# Patient Record
Sex: Male | Born: 1955 | Race: Black or African American | Hispanic: No | Marital: Single | Smoking: Current every day smoker
Health system: Southern US, Community
[De-identification: ages and names within clinical notes are randomized; demographics above are authoritative.]

## PROBLEM LIST (undated history)

## (undated) DIAGNOSIS — I499 Cardiac arrhythmia, unspecified: Secondary | ICD-10-CM

## (undated) DIAGNOSIS — E1140 Type 2 diabetes mellitus with diabetic neuropathy, unspecified: Secondary | ICD-10-CM

## (undated) DIAGNOSIS — I10 Essential (primary) hypertension: Secondary | ICD-10-CM

## (undated) DIAGNOSIS — Z8619 Personal history of other infectious and parasitic diseases: Secondary | ICD-10-CM

## (undated) DIAGNOSIS — M1990 Unspecified osteoarthritis, unspecified site: Secondary | ICD-10-CM

## (undated) HISTORY — DX: Essential (primary) hypertension: I10

## (undated) HISTORY — PX: EYE SURGERY: SHX253

## (undated) HISTORY — DX: Type 2 diabetes mellitus with diabetic neuropathy, unspecified: E11.40

---

## 2009-03-10 ENCOUNTER — Emergency Department (HOSPITAL_COMMUNITY): Admission: EM | Admit: 2009-03-10 | Discharge: 2009-03-10 | Payer: Self-pay | Admitting: Emergency Medicine

## 2009-04-26 ENCOUNTER — Emergency Department (HOSPITAL_COMMUNITY): Admission: EM | Admit: 2009-04-26 | Discharge: 2009-04-26 | Payer: Self-pay | Admitting: Family Medicine

## 2009-06-15 ENCOUNTER — Emergency Department (HOSPITAL_COMMUNITY): Admission: EM | Admit: 2009-06-15 | Discharge: 2009-06-15 | Payer: Self-pay | Admitting: Emergency Medicine

## 2010-09-22 LAB — POCT I-STAT, CHEM 8
Creatinine, Ser: 0.7 mg/dL (ref 0.4–1.5)
Hemoglobin: 17.7 g/dL — ABNORMAL HIGH (ref 13.0–17.0)
Potassium: 4.2 mEq/L (ref 3.5–5.1)
Sodium: 138 mEq/L (ref 135–145)
TCO2: 24 mmol/L (ref 0–100)

## 2010-09-22 LAB — GLUCOSE, CAPILLARY: Glucose-Capillary: 335 mg/dL — ABNORMAL HIGH (ref 70–99)

## 2016-06-20 HISTORY — PX: OTHER SURGICAL HISTORY: SHX169

## 2016-06-20 HISTORY — PX: FRACTURE SURGERY: SHX138

## 2016-07-22 ENCOUNTER — Ambulatory Visit: Payer: Self-pay | Admitting: Cardiovascular Disease

## 2016-09-07 ENCOUNTER — Telehealth: Payer: Self-pay

## 2016-09-07 NOTE — Telephone Encounter (Signed)
Pt received triage letter from DS. Please call him back at [phone]

## 2016-09-08 ENCOUNTER — Telehealth: Payer: Self-pay

## 2016-09-08 NOTE — Telephone Encounter (Signed)
See separate triage.  

## 2016-09-12 NOTE — Telephone Encounter (Signed)
Gastroenterology Pre-Procedure Review  Request Date:09/08/2016 Requesting Physician:   PATIENT REVIEW QUESTIONS: The patient responded to the following health history questions as indicated:    1. Diabetes Melitis: YES 2. Joint replacements in the past 12 months: no 3. Major health problems in the past 3 months: no 4. Has an artificial valve or MVP: no 5. Has a defibrillator: no 6. Has been advised in past to take antibiotics in advance of a procedure like teeth cleaning: no 7. Family history of colon cancer: no  8. Alcohol Use: Occasionally 9. History of sleep apnea: no  10. History of coronary artery or other vascular stents placed within the last 12 months: NO  MEDICATIONS & ALLERGIES:    Patient reports the following regarding taking any blood thinners:   Plavix? no Aspirin? no Coumadin? no Brilinta? no Xarelto? no Eliquis? no Pradaxa? no Savaysa? no Effient? no  Patient confirms/reports the following medications:  Current Outpatient Prescriptions  Medication Sig Dispense Refill  . gabapentin (NEURONTIN) 400 MG capsule Take 400 mg by mouth 3 (three) times daily. PT takes 2 tablets ( 800 mg) tid    . glipiZIDE (GLUCOTROL) 5 MG tablet Take by mouth 2 (two) times daily before a meal.    . metFORMIN (GLUCOPHAGE) 1000 MG tablet Take 1,000 mg by mouth 2 (two) times daily with a meal.     No current facility-administered medications for this visit.     Patient confirms/reports the following allergies:  No Known Allergies  No orders of the defined types were placed in this encounter.   AUTHORIZATION INFORMATION Primary Insurance:   ID #:  Group #:  Pre-Cert / Auth required:  Pre-Cert / Auth #:   Secondary Insurance:   ID #:   Group #:  Pre-Cert / Auth required: Pre-Cert / Auth #:   SCHEDULE INFORMATION: Procedure has been scheduled as follows:  Date: 10/18/2016              Time: 10:00 AM Location: [facility] Short Stay  This Gastroenterology Pre-Precedure  Review Form is being routed to the following provider(s): R. Garfield Cornea, MD

## 2016-09-12 NOTE — Telephone Encounter (Signed)
Ok to schedule. DM meds: half night before and none morning of.

## 2016-09-14 ENCOUNTER — Other Ambulatory Visit: Payer: Self-pay

## 2016-09-14 DIAGNOSIS — Z800 Family history of malignant neoplasm of digestive organs: Secondary | ICD-10-CM

## 2016-09-14 MED ORDER — PEG 3350-KCL-NA BICARB-NACL 420 G PO SOLR: 4000.0000 mL | ORAL | 0 refills | Status: DC

## 2016-09-14 NOTE — Telephone Encounter (Signed)
Rx sent to the pharmacy and instructions mailed to pt.  

## 2016-10-06 ENCOUNTER — Other Ambulatory Visit (HOSPITAL_COMMUNITY): Payer: Self-pay | Admitting: Neurology

## 2016-10-06 DIAGNOSIS — R29898 Other symptoms and signs involving the musculoskeletal system: Secondary | ICD-10-CM

## 2016-10-06 DIAGNOSIS — R52 Pain, unspecified: Secondary | ICD-10-CM

## 2016-10-10 ENCOUNTER — Telehealth: Payer: Self-pay | Admitting: Internal Medicine

## 2016-10-10 NOTE — Telephone Encounter (Signed)
PT wanted to be scheduled later in the day on 10/18/2016. I told him there were no appts available on that day, I would have to schedule him for another day. He did not want to change to another day. He said he will try to get someone else to bring him, the previous person had to go to [facility] early that morning and then will be returning. I told him he has to have someone with him that can stay with him while he has the procedure.  He will let me know if there is a problem.

## 2016-10-10 NOTE — Telephone Encounter (Signed)
Pt is scheduled with [facility] on 5/1 for colonoscopy. He needs to change the times because he can't be at the hospital at 9 am. Please call him back at [phone]

## 2016-10-13 ENCOUNTER — Ambulatory Visit (HOSPITAL_COMMUNITY): Payer: Medicaid Other

## 2016-10-18 ENCOUNTER — Encounter (HOSPITAL_COMMUNITY)
Admission: RE | Disposition: A | Discharge status: Home / Self Care | Payer: Self-pay | Source: Ambulatory Visit | Attending: Internal Medicine

## 2016-10-18 ENCOUNTER — Ambulatory Visit (HOSPITAL_COMMUNITY)
Admission: RE | Admit: 2016-10-18 | Discharge: 2016-10-18 | Disposition: A | Discharge status: Home / Self Care | Payer: Medicaid Other | Source: Ambulatory Visit | Attending: Internal Medicine | Admitting: Internal Medicine

## 2016-10-18 ENCOUNTER — Encounter (HOSPITAL_COMMUNITY): Payer: Self-pay | Admitting: *Deleted

## 2016-10-18 DIAGNOSIS — E119 Type 2 diabetes mellitus without complications: Secondary | ICD-10-CM | POA: Diagnosis not present

## 2016-10-18 DIAGNOSIS — Z7984 Long term (current) use of oral hypoglycemic drugs: Secondary | ICD-10-CM | POA: Diagnosis not present

## 2016-10-18 DIAGNOSIS — Z800 Family history of malignant neoplasm of digestive organs: Secondary | ICD-10-CM | POA: Diagnosis not present

## 2016-10-18 DIAGNOSIS — F17210 Nicotine dependence, cigarettes, uncomplicated: Secondary | ICD-10-CM | POA: Insufficient documentation

## 2016-10-18 DIAGNOSIS — D123 Benign neoplasm of transverse colon: Secondary | ICD-10-CM | POA: Diagnosis not present

## 2016-10-18 DIAGNOSIS — Z79899 Other long term (current) drug therapy: Secondary | ICD-10-CM | POA: Insufficient documentation

## 2016-10-18 DIAGNOSIS — Z1211 Encounter for screening for malignant neoplasm of colon: Secondary | ICD-10-CM | POA: Diagnosis not present

## 2016-10-18 DIAGNOSIS — I10 Essential (primary) hypertension: Secondary | ICD-10-CM | POA: Diagnosis not present

## 2016-10-18 DIAGNOSIS — Z1212 Encounter for screening for malignant neoplasm of rectum: Secondary | ICD-10-CM | POA: Diagnosis not present

## 2016-10-18 HISTORY — PX: COLONOSCOPY: SHX5424

## 2016-10-18 LAB — GLUCOSE, CAPILLARY: GLUCOSE-CAPILLARY: 188 mg/dL — AB (ref 65–99)

## 2016-10-18 SURGERY — COLONOSCOPY
Anesthesia: Moderate Sedation

## 2016-10-18 MED ORDER — MIDAZOLAM HCL 5 MG/5ML IJ SOLN
INTRAMUSCULAR | Status: AC
  Filled 2016-10-18: qty 10

## 2016-10-18 MED ORDER — SODIUM CHLORIDE 0.9 % IV SOLN
INTRAVENOUS | Status: DC
  Administered 2016-10-18: 10:00:00 via INTRAVENOUS

## 2016-10-18 MED ORDER — MEPERIDINE HCL 100 MG/ML IJ SOLN
INTRAMUSCULAR | Status: AC
  Filled 2016-10-18: qty 2

## 2016-10-18 MED ORDER — STERILE WATER FOR IRRIGATION IR SOLN
Status: DC | PRN
  Administered 2016-10-18: 10:00:00

## 2016-10-18 MED ORDER — ONDANSETRON HCL 4 MG/2ML IJ SOLN
INTRAMUSCULAR | Status: DC | PRN
  Administered 2016-10-18: 4 mg via INTRAVENOUS

## 2016-10-18 MED ORDER — MIDAZOLAM HCL 5 MG/5ML IJ SOLN
INTRAMUSCULAR | Status: DC | PRN
  Administered 2016-10-18 (×2): 2 mg via INTRAVENOUS

## 2016-10-18 MED ORDER — MEPERIDINE HCL 100 MG/ML IJ SOLN
INTRAMUSCULAR | Status: DC | PRN
  Administered 2016-10-18: 50 mg via INTRAVENOUS
  Administered 2016-10-18: 25 mg via INTRAVENOUS

## 2016-10-18 MED ORDER — LIDOCAINE VISCOUS 2 % MT SOLN
OROMUCOSAL | Status: AC
  Filled 2016-10-18: qty 15

## 2016-10-18 MED ORDER — ONDANSETRON HCL 4 MG/2ML IJ SOLN
INTRAMUSCULAR | Status: AC
  Filled 2016-10-18: qty 2

## 2016-10-18 NOTE — H&P (Signed)
@LOGO @   Primary Care Physician:  Jessee Avers, FNP Primary Gastroenterologist:  Dr. Gala Romney  Pre-Procedure History & Physical: HPI:  Ryan King is a 61 y.o. male is here for a screening colonoscopy.  No bowel symptoms. Family history of colon cancer in his brother-diagnosed in his 28s. No prior colonoscopy.  Past Medical History:  Diagnosis Date  . Diabetes mellitus without complication ([place])   . Hypertension     Past Surgical History:  Procedure Laterality Date  . Arm Surgery Right 06/2016  . FRACTURE SURGERY Right 06/2016    Prior to Admission medications   Medication Sig Start Date End Date Taking? Authorizing Provider  glipiZIDE (GLUCOTROL) 5 MG tablet Take by mouth 2 (two) times daily before a meal.   Yes Historical Provider, MD  metFORMIN (GLUCOPHAGE) 1000 MG tablet Take 1,000 mg by mouth 2 (two) times daily with a meal.   Yes Historical Provider, MD  polyethylene glycol-electrolytes (TRILYTE) 420 g solution Take 4,000 mLs by mouth as directed. 09/14/16  Yes Daneil Dolin, MD  pregabalin (LYRICA) 50 MG capsule Take 50 mg by mouth 2 (two) times daily.   Yes Historical Provider, MD  traMADol (ULTRAM) 50 MG tablet Take 50-100 mg by mouth 2 (two) times daily as needed for moderate pain.   Yes Historical Provider, MD    Allergies as of 09/14/2016  . (No Known Allergies)    Family History  Problem Relation Age of Onset  . Diabetes Mother   . Hypertension Mother   . Lung cancer Brother   . Multiple sclerosis Brother   . Stroke Brother   . Diabetes Mellitus II Brother     Social History   Social History  . Marital status: Single    Spouse name: N/A  . Number of children: N/A  . Years of education: N/A   Occupational History  . Not on file.   Social History Main Topics  . Smoking status: Current Every Day Smoker    Packs/day: 1.00    Years: 45.00    Types: Cigarettes  . Smokeless tobacco: Never Used  . Alcohol use Yes     Comment: Rarely-Once month  .  Drug use: No  . Sexual activity: Not on file   Other Topics Concern  . Not on file   Social History Narrative  . No narrative on file    Review of Systems: See HPI, otherwise negative ROS  Physical Exam: BP (!) 151/95   Pulse 75   Temp 98.7 F (37.1 C) (Oral)   Resp (!) 21   Ht 5\' 10"  (1.778 m)   Wt 150 lb (68 kg)   SpO2 100%   BMI 21.52 kg/m  General:   Alert,   pleasant and cooperative in NAD Neck:  Supple; no masses or thyromegaly. Lungs:  Clear throughout to auscultation.   No wheezes, crackles, or rhonchi. No acute distress. Heart:  Regular rate and rhythm; no murmurs, clicks, rubs,  or gallops. Abdomen:  Soft, nontender and nondistended. No masses, hepatosplenomegaly or hernias noted. Normal bowel sounds, without guarding, and without rebound.    Impression/Plan: Ryan King is now here to undergo a screening colonoscopy.  First ever high-risk screening examination.  Risks, benefits, limitations, imponderables and alternatives regarding colonoscopy have been reviewed with the patient. Questions have been answered. All parties agreeable.     Notice:  This dictation was prepared with Dragon dictation along with smaller phrase technology. Any transcriptional errors that result from this process are  unintentional and may not be corrected upon review.

## 2016-10-18 NOTE — Discharge Instructions (Signed)
Polyp information provided  Further recommendations to follow pending review of pathology report  Colonoscopy Discharge Instructions  Read the instructions outlined below and refer to this sheet in the next few weeks. These discharge instructions provide you with general information on caring for yourself after you leave the hospital. Your doctor may also give you specific instructions. While your treatment has been planned according to the most current medical practices available, unavoidable complications occasionally occur. If you have any problems or questions after discharge, call Dr. Gala Romney at [phone]. ACTIVITY  You may resume your regular activity, but move at a slower pace for the next 24 hours.   Take frequent rest periods for the next 24 hours.   Walking will help get rid of the air and reduce the bloated feeling in your belly (abdomen).   No driving for 24 hours (because of the medicine (anesthesia) used during the test).    Do not sign any important legal documents or operate any machinery for 24 hours (because of the anesthesia used during the test).  NUTRITION  Drink plenty of fluids.   You may resume your normal diet as instructed by your doctor.   Begin with a light meal and progress to your normal diet. Heavy or fried foods are harder to digest and may make you feel sick to your stomach (nauseated).   Avoid alcoholic beverages for 24 hours or as instructed.  MEDICATIONS  You may resume your normal medications unless your doctor tells you otherwise.  WHAT YOU CAN EXPECT TODAY  Some feelings of bloating in the abdomen.   Passage of more gas than usual.   Spotting of blood in your stool or on the toilet paper.  IF YOU HAD POLYPS REMOVED DURING THE COLONOSCOPY:  No aspirin products for 7 days or as instructed.   No alcohol for 7 days or as instructed.   Eat a soft diet for the next 24 hours.  FINDING OUT THE RESULTS OF YOUR TEST Not all test results are  available during your visit. If your test results are not back during the visit, make an appointment with your caregiver to find out the results. Do not assume everything is normal if you have not heard from your caregiver or the medical facility. It is important for you to follow up on all of your test results.  SEEK IMMEDIATE MEDICAL ATTENTION IF:  You have more than a spotting of blood in your stool.   Your belly is swollen (abdominal distention).   You are nauseated or vomiting.   You have a temperature over 101.   You have abdominal pain or discomfort that is severe or gets worse throughout the day.    Colon Polyps Polyps are tissue growths inside the body. Polyps can grow in many places, including the large intestine (colon). A polyp may be a round bump or a mushroom-shaped growth. You could have one polyp or several. Most colon polyps are noncancerous (benign). However, some colon polyps can become cancerous over time. What are the causes? The exact cause of colon polyps is not known. What increases the risk? This condition is more likely to develop in people who:  Have a family history of colon cancer or colon polyps.  Are older than 37 or older than 45 if they are African American.  Have inflammatory bowel disease, such as ulcerative colitis or Crohn disease.  Are overweight.  Smoke cigarettes.  Do not get enough exercise.  Drink too much alcohol.  Eat  a diet that is:  High in fat and red meat.  Low in fiber.  Had childhood cancer that was treated with abdominal radiation. What are the signs or symptoms? Most polyps do not cause symptoms. If you have symptoms, they may include:  Blood coming from your rectum when having a bowel movement.  Blood in your stool.The stool may look dark red or black.  A change in bowel habits, such as constipation or diarrhea. How is this diagnosed? This condition is diagnosed with a colonoscopy. This is a procedure that uses  a lighted, flexible scope to look at the inside of your colon. How is this treated? Treatment for this condition involves removing any polyps that are found. Those polyps will then be tested for cancer. If cancer is found, your health care provider will talk to you about options for colon cancer treatment. Follow these instructions at home: Diet   Eat plenty of fiber, such as fruits, vegetables, and whole grains.  Eat foods that are high in calcium and vitamin D, such as milk, cheese, yogurt, eggs, liver, fish, and broccoli.  Limit foods high in fat, red meats, and processed meats, such as hot dogs, sausage, bacon, and lunch meats.  Maintain a healthy weight, or lose weight if recommended by your health care provider. General instructions   Do not smoke cigarettes.  Do not drink alcohol excessively.  Keep all follow-up visits as told by your health care provider. This is important. This includes keeping regularly scheduled colonoscopies. Talk to your health care provider about when you need a colonoscopy.  Exercise every day or as told by your health care provider. Contact a health care provider if:  You have new or worsening bleeding during a bowel movement.  You have new or increased blood in your stool.  You have a change in bowel habits.  You unexpectedly lose weight. This information is not intended to replace advice given to you by your health care provider. Make sure you discuss any questions you have with your health care provider. Document Released: 03/02/2004 Document Revised: 11/12/2015 Document Reviewed: 04/27/2015 Elsevier Interactive Patient Education  2017 [employer].

## 2016-10-18 NOTE — Op Note (Signed)
[facility] Patient Name: Ryan King Procedure Date: 10/18/2016 9:47 AM MRN: [id number] Date of Birth: 11-27-1955 Attending MD: Norvel Richards , MD CSN: [id number] Age: 61 Admit Type: Outpatient Procedure:                Colonoscopy with multiple snare polypectomies Indications:              Screening for colorectal malignant neoplasm Providers:                Norvel Richards, MD, Janeece Riggers, RN, Randa Spike, Technician Referring MD:              Medicines:                Midazolam 4 mg IV, Meperidine 75 mg IV, Ondansetron                            4 mg IV Complications:            No immediate complications. Estimated Blood Loss:     Estimated blood loss was minimal. Procedure:                Pre-Anesthesia Assessment:                           - Prior to the procedure, a History and Physical                            was performed, and patient medications and                            allergies were reviewed. The patient's tolerance of                            previous anesthesia was also reviewed. The risks                            and benefits of the procedure and the sedation                            options and risks were discussed with the patient.                            All questions were answered, and informed consent                            was obtained. Prior Anticoagulants: The patient has                            taken no previous anticoagulant or antiplatelet                            agents. ASA Grade Assessment: III - A patient with  severe systemic disease. After reviewing the risks                            and benefits, the patient was deemed in                            satisfactory condition to undergo the procedure.                           After obtaining informed consent, the colonoscope                            was passed under direct vision. Throughout the                        procedure, the patient's blood pressure, pulse, and                            oxygen saturations were monitored continuously. The                            EC-[id number] ([id number]) scope was introduced through                            the anus and advanced to the the cecum, identified                            by appendiceal orifice and ileocecal valve. The                            ileocecal valve, appendiceal orifice, and rectum                            were photographed. The entire colon was well                            visualized. The colonoscopy was performed without                            difficulty. The quality of the bowel preparation                            was adequate. Scope In: 10:12:53 AM Scope Out: 10:36:56 AM Scope Withdrawal Time: 0 hours 16 minutes 2 seconds  Total Procedure Duration: 0 hours 24 minutes 3 seconds  Findings:      The perianal and digital rectal examinations were normal.      The exam was otherwise without abnormality on direct and retroflexion       views.      Four sessile polyps were found in the hepatic flexure. The polyps were 4       to 7 mm in size. These polyps were removed with a cold snare. Resection       and retrieval were complete. Impression:               - The examination was  otherwise normal on direct                            and retroflexion views.                           - Four 4 to 7 mm polyps at the hepatic flexure,                            removed with a cold snare. Resected and retrieved. Moderate Sedation:      Moderate (conscious) sedation was administered by the endoscopy nurse       and supervised by the endoscopist. The following parameters were       monitored: oxygen saturation, heart rate, blood pressure, respiratory       rate, EKG, adequacy of pulmonary ventilation, and response to care.       Total physician intraservice time was 32 minutes. Recommendation:           - Patient has  a contact number available for                            emergencies. The signs and symptoms of potential                            delayed complications were discussed with the                            patient. Return to normal activities tomorrow.                            Written discharge instructions were provided to the                            patient.                           - Resume previous diet.                           - Continue present medications.                           - Repeat colonoscopy date to be determined after                            pending pathology results are reviewed for                            surveillance.                           - Return to GI clinic (date not yet determined). Procedure Code(s):        --- Professional ---                           [phone], Colonoscopy, flexible; with removal of  tumor(s), polyp(s), or other lesion(s) by snare                            technique                           99152, Moderate sedation services provided by the                            same physician or other qualified health care                            professional performing the diagnostic or                            therapeutic service that the sedation supports,                            requiring the presence of an independent trained                            observer to assist in the monitoring of the                            patient's level of consciousness and physiological                            status; initial 15 minutes of intraservice time,                            patient age 27 years or older                           [id number], Moderate sedation services; each additional                            15 minutes intraservice time Diagnosis Code(s):        --- Professional ---                           Z12.11, Encounter for screening for malignant                            neoplasm of colon                            D12.3, Benign neoplasm of transverse colon (hepatic                            flexure or splenic flexure) CPT copyright 2016 American Medical Association. All rights reserved. The codes documented in this report are preliminary and upon coder review may  be revised to meet current compliance requirements. Cristopher Estimable. Artesia Berkey, MD Norvel Richards, MD 10/18/2016 10:48:23 AM This report has been signed electronically. Number of Addenda: 0

## 2016-10-20 ENCOUNTER — Encounter (HOSPITAL_COMMUNITY): Payer: Self-pay | Admitting: Internal Medicine

## 2016-10-21 ENCOUNTER — Ambulatory Visit (INDEPENDENT_AMBULATORY_CARE_PROVIDER_SITE_OTHER): Payer: Medicaid Other | Admitting: Cardiology

## 2016-10-21 ENCOUNTER — Ambulatory Visit (INDEPENDENT_AMBULATORY_CARE_PROVIDER_SITE_OTHER): Payer: Medicaid Other

## 2016-10-21 ENCOUNTER — Encounter: Payer: Self-pay | Admitting: Cardiology

## 2016-10-21 VITALS — BP 122/74 | HR 82 | Ht 70.0 in | Wt 150.0 lb

## 2016-10-21 DIAGNOSIS — E1140 Type 2 diabetes mellitus with diabetic neuropathy, unspecified: Secondary | ICD-10-CM | POA: Diagnosis not present

## 2016-10-21 DIAGNOSIS — I499 Cardiac arrhythmia, unspecified: Secondary | ICD-10-CM | POA: Diagnosis not present

## 2016-10-21 DIAGNOSIS — Z8679 Personal history of other diseases of the circulatory system: Secondary | ICD-10-CM | POA: Diagnosis not present

## 2016-10-21 DIAGNOSIS — Z720 Tobacco use: Secondary | ICD-10-CM | POA: Diagnosis not present

## 2016-10-21 NOTE — Progress Notes (Signed)
Cardiology Office Note  Date: 10/21/2016   ID: Ryan King, DOB 1955/12/29, MRN [id number]  PCP: Jessee Avers, FNP  Consulting Cardiologist: Rozann Lesches, MD   Chief Complaint  Patient presents with  . Irregular heartbeat    History of Present Illness: Ryan King is a 61 y.o. male referred for cardiology consultation by Ms. Eulas Post NP with the [facility] for the assessment of irregular heartbeat. My understanding is that he was found to have an irregular heart beat on examination, did have an ECG obtained as detailed below. He is not however aware of any specific sense of palpitations, dizziness, or chest pain. His main complaint is of peripheral neuropathy symptoms.  I did review the faxed copy of that his ECG as detailed below. He had a PAC noted at that time. He does not have any history of cardiac arrhythmias.  We went over his medications. He is currently on oral agents for management of type 2 diabetes mellitus.  Past Medical History:  Diagnosis Date  . Essential hypertension   . Type 2 diabetes mellitus with diabetic neuropathy [facility])     Past Surgical History:  Procedure Laterality Date  . Arm Surgery Right 06/2016  . COLONOSCOPY N/A 10/18/2016   Procedure: COLONOSCOPY;  Surgeon: Daneil Dolin, MD;  Location: [facility];  Service: Endoscopy;  Laterality: N/A;  10:00 AM  . FRACTURE SURGERY Right 06/2016    Current Outpatient Prescriptions  Medication Sig Dispense Refill  . glipiZIDE (GLUCOTROL) 5 MG tablet Take by mouth 2 (two) times daily before a meal.    . metFORMIN (GLUCOPHAGE) 1000 MG tablet Take 1,000 mg by mouth 2 (two) times daily with a meal.    . pregabalin (LYRICA) 50 MG capsule Take 50 mg by mouth 2 (two) times daily.    . traMADol (ULTRAM) 50 MG tablet Take 50-100 mg by mouth 2 (two) times daily as needed for moderate pain.    . polyethylene glycol-electrolytes (TRILYTE) 420 g solution Take 4,000 mLs by mouth as directed. 4000 mL  0   No current facility-administered medications for this visit.    Allergies:  Patient has no known allergies.   Social History: The patient  reports that he has been smoking Cigarettes.  He has a 45.00 pack-year smoking history. He has never used smokeless tobacco. He reports that he drinks alcohol. He reports that he does not use drugs.   Family History: The patient's family history includes Diabetes in his mother; Diabetes Mellitus II in his brother; Hypertension in his mother; Lung cancer in his brother; Multiple sclerosis in his brother; Stroke in his brother.   ROS:  Please see the history of present illness. Otherwise, complete review of systems is positive for peripheral neuropathy, describes numbness and tingling in his feet and legs.  All other systems are reviewed and negative.   Physical Exam: VS:  BP 122/74 (BP Location: Right Arm)   Pulse 82   Ht 5\' 10"  (1.778 m)   Wt 150 lb (68 kg)   SpO2 96%   BMI 21.52 kg/m , BMI Body mass index is 21.52 kg/m.  Wt Readings from Last 3 Encounters:  10/21/16 150 lb (68 kg)  10/18/16 150 lb (68 kg)    General: Thin male, appears comfortable at rest. HEENT: Conjunctiva and lids normal, oropharynx clear with poor dentition. Neck: Supple, no elevated JVP or carotid bruits, no thyromegaly. Lungs: Clear to auscultation, nonlabored breathing at rest. Cardiac: Regular rate and  rhythm, soft S4, no significant systolic murmur, no pericardial rub. Abdomen: Soft, nontender, bowel sounds present, no guarding or rebound. Extremities: No pitting edema, distal pulses 1-2+. Skin: Warm and dry. Musculoskeletal: No kyphosis. Neuropsychiatric: Alert and oriented x3, affect grossly appropriate.  ECG: I personally reviewed the tracing from 06/09/2016 which is a poor quality faxed copy showing what looks to be sinus tachycardia, possible biatrial enlargement, and PAC.  Recent Labwork:  November 2017: Potassium 3.5, BUN 6, creatinine 0.43, AST 14,  ALT 17, hemoglobin 14.0, platelets 295  Assessment and Plan:  1. History of irregular heartbeat and documentation of at least a PAC based on prior ECG. He is not specifically aware of any palpitations and has no history of cardiac arrhythmia. We discussed obtaining a 24-hour Holter monitor mainly to exclude any obvious atrial fibrillation. Otherwise no further cardiac workup planned at this time.  2. Type 2 diabetes mellitus with neuropathy. Keep follow-up with PCP.  3. History of hypertension, currently not on antihypertensives. Blood pressure is normal today.  4. Tobacco abuse, smoking cessation recommended.  Current medicines were reviewed with the patient today.   Orders Placed This Encounter  Procedures  . Holter monitor - 24 hour    Disposition: Call with test results.  Signed, Satira Sark, MD, [facility] 10/21/2016 3:13 PM    [facility] [place], [place],  [address] Phone: [phone]; Fax: [phone]

## 2016-10-21 NOTE — Patient Instructions (Signed)
Medication Instructions:  Continue all current medications.  Labwork: none  Testing/Procedures:  Your physician has recommended that you wear a 24 hour holter monitor. Holter monitors are medical devices that record the heart's electrical activity. Doctors most often use these monitors to diagnose arrhythmias. Arrhythmias are problems with the speed or rhythm of the heartbeat. The monitor is a small, portable device. You can wear one while you do your normal daily activities. This is usually used to diagnose what is causing palpitations/syncope (passing out).  Office will contact with results via phone or letter.    Follow-Up: Pending test results   Any Other Special Instructions Will Be Listed Below (If Applicable).  If you need a refill on your cardiac medications before your next appointment, please call your pharmacy.

## 2016-10-23 ENCOUNTER — Encounter: Payer: Self-pay | Admitting: Internal Medicine

## 2016-11-01 ENCOUNTER — Telehealth: Payer: Self-pay

## 2016-11-01 NOTE — Telephone Encounter (Signed)
-----   Message from Satira Sark, MD sent at 11/01/2016  7:52 AM EDT ----- Results reviewed. Sinus rhythm present with occasional PACs and brief bursts of PAT. This explains his intermittently irregular heartbeat. Importantly, no atrial fibrillation was documented. In light of no active symptoms of palpitations, no further cardiac workup is planned at this time. Would keep follow-up with PCP. A copy of this test should be forwarded to Jessee Avers, FNP.

## 2016-11-01 NOTE — Telephone Encounter (Signed)
Patient notified. Routed to PCP 

## 2016-11-07 ENCOUNTER — Other Ambulatory Visit (HOSPITAL_COMMUNITY): Payer: Medicaid Other

## 2016-11-07 ENCOUNTER — Ambulatory Visit (HOSPITAL_COMMUNITY)
Admission: RE | Admit: 2016-11-07 | Discharge: 2016-11-07 | Disposition: A | Discharge status: Home / Self Care | Payer: Medicaid Other | Source: Ambulatory Visit | Attending: Neurology | Admitting: Neurology

## 2016-11-07 DIAGNOSIS — M4802 Spinal stenosis, cervical region: Secondary | ICD-10-CM | POA: Insufficient documentation

## 2016-11-07 DIAGNOSIS — R52 Pain, unspecified: Secondary | ICD-10-CM

## 2016-11-07 DIAGNOSIS — M79606 Pain in leg, unspecified: Secondary | ICD-10-CM | POA: Diagnosis present

## 2016-11-07 DIAGNOSIS — R29898 Other symptoms and signs involving the musculoskeletal system: Secondary | ICD-10-CM

## 2016-12-22 ENCOUNTER — Other Ambulatory Visit: Payer: Self-pay | Admitting: Neurosurgery

## 2017-01-16 ENCOUNTER — Encounter (HOSPITAL_COMMUNITY): Payer: Self-pay

## 2017-01-16 ENCOUNTER — Encounter (HOSPITAL_COMMUNITY)
Admission: RE | Admit: 2017-01-16 | Discharge: 2017-01-16 | Disposition: A | Discharge status: Home / Self Care | Payer: Medicaid Other | Source: Ambulatory Visit | Attending: Neurosurgery | Admitting: Neurosurgery

## 2017-01-16 DIAGNOSIS — Z01818 Encounter for other preprocedural examination: Secondary | ICD-10-CM | POA: Diagnosis not present

## 2017-01-16 DIAGNOSIS — E119 Type 2 diabetes mellitus without complications: Secondary | ICD-10-CM | POA: Insufficient documentation

## 2017-01-16 HISTORY — DX: Cardiac arrhythmia, unspecified: I49.9

## 2017-01-16 HISTORY — DX: Unspecified osteoarthritis, unspecified site: M19.90

## 2017-01-16 LAB — BASIC METABOLIC PANEL
Anion gap: 9 (ref 5–15)
BUN: 12 mg/dL (ref 6–20)
CALCIUM: 9.4 mg/dL (ref 8.9–10.3)
CO2: 23 mmol/L (ref 22–32)
CREATININE: 0.67 mg/dL (ref 0.61–1.24)
Chloride: 106 mmol/L (ref 101–111)
GFR calc Af Amer: >60 mL/min (ref >60)
Glucose, Bld: 158 mg/dL — ABNORMAL HIGH (ref 65–99)
POTASSIUM: 4.4 mmol/L (ref 3.5–5.1)
SODIUM: 138 mmol/L (ref 135–145)

## 2017-01-16 LAB — CBC
HCT: 41.6 % (ref 39.0–52.0)
Hemoglobin: 14.2 g/dL (ref 13.0–17.0)
MCH: 30.4 pg (ref 26.0–34.0)
MCHC: 34.1 g/dL (ref 30.0–36.0)
MCV: 89.1 fL (ref 78.0–100.0)
PLATELETS: 272 10*3/uL (ref 150–400)
RBC: 4.67 MIL/uL (ref 4.22–5.81)
RDW: 12.5 % (ref 11.5–15.5)
WBC: 10.2 10*3/uL (ref 4.0–10.5)

## 2017-01-16 LAB — SURGICAL PCR SCREEN
MRSA, PCR: NEGATIVE
STAPHYLOCOCCUS AUREUS: NEGATIVE

## 2017-01-16 LAB — GLUCOSE, CAPILLARY: Glucose-Capillary: 163 mg/dL — ABNORMAL HIGH (ref 65–99)

## 2017-01-16 NOTE — Pre-Procedure Instructions (Signed)
Cooper Moroney  01/16/2017      [facility] [address] [place] [address] Phone: [phone] Fax: [phone]    Your procedure is scheduled on August 3  Report to [place] at 1030 A.M.  Call this number if you have problems the morning of surgery:  [phone]   Remember:  Do not eat food or drink liquids after midnight.   Take these medicines the morning of surgery with A SIP OF WATER pregabalin (LYRICA)  7 days prior to surgery STOP taking any Aspirin, Aleve, Naproxen, Ibuprofen, Motrin, Advil, Goody's, BC's, all herbal medications, fish oil, and all vitamins  WHAT DO I DO ABOUT MY DIABETES MEDICATION?  Marland Kitchen Do not take oral diabetes medicines (pills) the morning of surgery. metFORMIN (GLUCOPHAGE) and glipiZIDE (GLUCOTROL XL)    How to Manage Your Diabetes Before and After Surgery  Why is it important to control my blood sugar before and after surgery? . Improving blood sugar levels before and after surgery helps healing and can limit problems. . A way of improving blood sugar control is eating a healthy diet by: o  Eating less sugar and carbohydrates o  Increasing activity/exercise o  Talking with your doctor about reaching your blood sugar goals . High blood sugars (greater than 180 mg/dL) can raise your risk of infections and slow your recovery, so you will need to focus on controlling your diabetes during the weeks before surgery. . Make sure that the doctor who takes care of your diabetes knows about your planned surgery including the date and location.  How do I manage my blood sugar before surgery? . Check your blood sugar at least 4 times a day, starting 2 days before surgery, to make sure that the level is not too high or low. o Check your blood sugar the morning of your surgery when you wake up and every 2 hours until you get to the Short Stay unit. . If your blood sugar is less than 70 mg/dL,  you will need to treat for low blood sugar: o Do not take insulin. o Treat a low blood sugar (less than 70 mg/dL) with  cup of clear juice (cranberry or apple), 4 glucose tablets, OR glucose gel. o Recheck blood sugar in 15 minutes after treatment (to make sure it is greater than 70 mg/dL). If your blood sugar is not greater than 70 mg/dL on recheck, call [phone] for further instructions. . Report your blood sugar to the short stay nurse when you get to Short Stay.  . If you are admitted to the hospital after surgery: o Your blood sugar will be checked by the staff and you will probably be given insulin after surgery (instead of oral diabetes medicines) to make sure you have good blood sugar levels. o The goal for blood sugar control after surgery is 80-180 mg/dL.     Do not wear jewelry, make-up or nail polish.  Do not wear lotions, powders, or perfumes, or deoderant.  Do not shave 48 hours prior to surgery.  Men may shave face and neck.  Do not bring valuables to the hospital.  [facility] is not responsible for any belongings or valuables.  Contacts, dentures or bridgework may not be worn into surgery.  Leave your suitcase in the car.  After surgery it may be brought to your room.  For patients admitted to the hospital, discharge time will be  determined by your treatment team.  Patients discharged the day of surgery will not be allowed to drive home.    Special instructions:   [place]- Preparing For Surgery  Before surgery, you can play an important role. Because skin is not sterile, your skin needs to be as free of germs as possible. You can reduce the number of germs on your skin by washing with CHG (chlorahexidine gluconate) Soap before surgery.  CHG is an antiseptic cleaner which kills germs and bonds with the skin to continue killing germs even after washing.  Please do not use if you have an allergy to CHG or antibacterial soaps. If your skin becomes  reddened/irritated stop using the CHG.  Do not shave (including legs and underarms) for at least 48 hours prior to first CHG shower. It is OK to shave your face.  Please follow these instructions carefully.   1. Shower the NIGHT BEFORE SURGERY and the MORNING OF SURGERY with CHG.   2. If you chose to wash your hair, wash your hair first as usual with your normal shampoo.  3. After you shampoo, rinse your hair and body thoroughly to remove the shampoo.  4. Use CHG as you would any other liquid soap. You can apply CHG directly to the skin and wash gently with a scrungie or a clean washcloth.   5. Apply the CHG Soap to your body ONLY FROM THE NECK DOWN.  Do not use on open wounds or open sores. Avoid contact with your eyes, ears, mouth and genitals (private parts). Wash genitals (private parts) with your normal soap.  6. Wash thoroughly, paying special attention to the area where your surgery will be performed.  7. Thoroughly rinse your body with warm water from the neck down.  8. DO NOT shower/wash with your normal soap after using and rinsing off the CHG Soap.  9. Pat yourself dry with a CLEAN TOWEL.   10. Wear CLEAN PAJAMAS   11. Place CLEAN SHEETS on your bed the night of your first shower and DO NOT SLEEP WITH PETS.    Day of Surgery: Do not apply any deodorants/lotions. Please wear clean clothes to the hospital/surgery center.      Please read over the following fact sheets that you were given.

## 2017-01-16 NOTE — Progress Notes (Addendum)
PCP - Was seeing Trena Platt but is now seeing Judd Lien Cardiologist - he said that he did not have a cardiologist but saw one in [place] 5/18  Chest x-ray - not needed EKG - 06/09/16 Stress Test - denies ECHO - denies Cardiac Cath - denies    Fasting Blood Sugar - doesn't check his sugars   Sending to anesthesia for review Saw cardiology for irregular rhythm 10/2016   Patient denies shortness of breath, fever, cough and chest pain at PAT appointment   Patient verbalized understanding of instructions that were given to them at the PAT appointment. Patient was also instructed that they will need to review over the PAT instructions again at home before surgery.

## 2017-01-17 ENCOUNTER — Encounter (HOSPITAL_COMMUNITY): Payer: Self-pay

## 2017-01-17 LAB — HEMOGLOBIN A1C
HEMOGLOBIN A1C: 8.1 % — AB (ref 4.8–5.6)
Mean Plasma Glucose: 186 mg/dL

## 2017-01-17 NOTE — Progress Notes (Signed)
Anesthesia Chart Review: Patient is a 61 year old male scheduled for ACDF C3-4, C4-5, possible C4 corpectomy on 01/20/2017 by Dr. Kathyrn Sheriff.  History includes smoking, hypertension, diabetes mellitus type 2, irregular heart beat (PACs, brief PAT on Holter).   - PCP is now Dr. Remo Lipps Burdine--was previously seeing Trena Platt, [place]. - Cardiologist is Dr. Rozann Lesches. He was seen on 11/01/16 for evaluation of irregular heart rate on exam with PAC on EKG. He recommended a 24 hour holter monitor that showed SR, occasional PACs, and brief bursts of PAT which likely explained patient intermittently irregular heartbeat. No atrial fibrillation was documented. In light of no active symptoms of palpitations, no further cardiac workup as planned. Ongoing PCP follow-up recommended.  Meds include glipizide, metformin, Lyrica.  BP 128/82   Pulse 73   Temp 36.8 C   Resp 20   Ht 5' 10.5" (1.791 m)   Wt 153 lb 9.6 oz (69.7 kg)   SpO2 100%   BMI 21.73 kg/m   EKG 06/09/16 [facility] ST at 100 bpm, occasional PAC, possible LAE, possible septal infarct (old).  24 Hour holter monitor 10/24/16: 24-hour Holter monitor reviewed. Sinus rhythm was present throughout. Heart rate ranged from 59 bpm up to 138 bpm with average heart rate 82 bpm. There were rare PVCs and occasional PACs (9.5% of total beats). Brief bursts of PAT were noted, representing the high heart rate range without any sustained tachycardia. There were no pauses. No atrial fibrillation.  MRI C-spine 11/07/16: IMPRESSION: - Severe spinal stenosis at C3-4 with cord compression due to spurring. Severe foraminal encroachment bilaterally. - Severe spinal stenosis C4-5 with cord compression and extensive cord hyperintensity bilaterally which appears chronic. Cord atrophy. Severe foraminal encroachment bilaterally. - Disc degeneration and diffuse spurring at C5-6, C6-7, C7-T1 as described above.  Preoperative labs noted. Cr  0.67. CBC WNL. Glucose 158. A1c 8.1 on 01/16/17 (consistent with average glucose 186). He will get a fasting CBG on arrival.  If no acute changes then I would anticipate that he can proceed as planned.   George Hugh [facility] Short Stay Center/Anesthesiology Phone [phone] 01/17/2017 2:10 PM

## 2017-01-20 ENCOUNTER — Inpatient Hospital Stay (HOSPITAL_COMMUNITY): Payer: Medicaid Other

## 2017-01-20 ENCOUNTER — Inpatient Hospital Stay (HOSPITAL_COMMUNITY): Payer: Medicaid Other | Admitting: Vascular Surgery

## 2017-01-20 ENCOUNTER — Inpatient Hospital Stay (HOSPITAL_COMMUNITY)
Admission: RE | Admit: 2017-01-20 | Discharge: 2017-01-21 | DRG: 473 | Disposition: A | Discharge status: Home / Self Care | Payer: Medicaid Other | Source: Ambulatory Visit | Attending: Neurosurgery | Admitting: Neurosurgery

## 2017-01-20 ENCOUNTER — Encounter (HOSPITAL_COMMUNITY)
Admission: RE | Disposition: A | Discharge status: Home / Self Care | Payer: Self-pay | Source: Ambulatory Visit | Attending: Neurosurgery

## 2017-01-20 ENCOUNTER — Encounter (HOSPITAL_COMMUNITY): Payer: Self-pay | Admitting: *Deleted

## 2017-01-20 ENCOUNTER — Inpatient Hospital Stay (HOSPITAL_COMMUNITY): Payer: Medicaid Other | Admitting: Certified Registered Nurse Anesthetist

## 2017-01-20 DIAGNOSIS — M2578 Osteophyte, vertebrae: Secondary | ICD-10-CM | POA: Diagnosis present

## 2017-01-20 DIAGNOSIS — Z79899 Other long term (current) drug therapy: Secondary | ICD-10-CM

## 2017-01-20 DIAGNOSIS — M4802 Spinal stenosis, cervical region: Secondary | ICD-10-CM | POA: Diagnosis present

## 2017-01-20 DIAGNOSIS — M50021 Cervical disc disorder at C4-C5 level with myelopathy: Secondary | ICD-10-CM | POA: Diagnosis present

## 2017-01-20 DIAGNOSIS — E1140 Type 2 diabetes mellitus with diabetic neuropathy, unspecified: Secondary | ICD-10-CM | POA: Diagnosis present

## 2017-01-20 DIAGNOSIS — Z419 Encounter for procedure for purposes other than remedying health state, unspecified: Secondary | ICD-10-CM

## 2017-01-20 DIAGNOSIS — I499 Cardiac arrhythmia, unspecified: Secondary | ICD-10-CM | POA: Diagnosis present

## 2017-01-20 DIAGNOSIS — Z9841 Cataract extraction status, right eye: Secondary | ICD-10-CM

## 2017-01-20 DIAGNOSIS — I10 Essential (primary) hypertension: Secondary | ICD-10-CM | POA: Diagnosis present

## 2017-01-20 DIAGNOSIS — F17210 Nicotine dependence, cigarettes, uncomplicated: Secondary | ICD-10-CM | POA: Diagnosis present

## 2017-01-20 DIAGNOSIS — M5000 Cervical disc disorder with myelopathy, unspecified cervical region: Secondary | ICD-10-CM | POA: Diagnosis present

## 2017-01-20 DIAGNOSIS — Z7984 Long term (current) use of oral hypoglycemic drugs: Secondary | ICD-10-CM

## 2017-01-20 HISTORY — PX: ANTERIOR CERVICAL DECOMP/DISCECTOMY FUSION: SHX1161

## 2017-01-20 LAB — GLUCOSE, CAPILLARY
Glucose-Capillary: 128 mg/dL — ABNORMAL HIGH (ref 65–99)
Glucose-Capillary: 144 mg/dL — ABNORMAL HIGH (ref 65–99)
Glucose-Capillary: 120 mg/dL — ABNORMAL HIGH (ref 65–99)
Glucose-Capillary: 153 mg/dL — ABNORMAL HIGH (ref 65–99)

## 2017-01-20 SURGERY — ANTERIOR CERVICAL DECOMPRESSION/DISCECTOMY FUSION 2 LEVELS
Anesthesia: General | Site: Spine Cervical

## 2017-01-20 MED ORDER — CEFAZOLIN SODIUM-DEXTROSE 2-4 GM/100ML-% IV SOLN
2.0000 g | Freq: Three times a day (TID) | INTRAVENOUS | Status: AC
  Administered 2017-01-20 – 2017-01-21 (×2): 2 g via INTRAVENOUS
  Filled 2017-01-20: qty 100

## 2017-01-20 MED ORDER — CHLORHEXIDINE GLUCONATE CLOTH 2 % EX PADS: 6.0000 | MEDICATED_PAD | Freq: Once | CUTANEOUS | Status: DC

## 2017-01-20 MED ORDER — ONDANSETRON HCL 4 MG PO TABS: 4.0000 mg | ORAL_TABLET | Freq: Four times a day (QID) | ORAL | Status: DC | PRN

## 2017-01-20 MED ORDER — ACETAMINOPHEN 325 MG PO TABS: 650.0000 mg | ORAL_TABLET | ORAL | Status: DC | PRN

## 2017-01-20 MED ORDER — PHENOL 1.4 % MT LIQD
1.0000 | OROMUCOSAL | Status: DC | PRN
  Administered 2017-01-20: 1 via OROMUCOSAL
  Filled 2017-01-20: qty 177

## 2017-01-20 MED ORDER — SUGAMMADEX SODIUM 200 MG/2ML IV SOLN
INTRAVENOUS | Status: DC | PRN
  Administered 2017-01-20: 150 mg via INTRAVENOUS

## 2017-01-20 MED ORDER — EPHEDRINE SULFATE 50 MG/ML IJ SOLN
INTRAMUSCULAR | Status: DC | PRN
  Administered 2017-01-20 (×2): 5 mg via INTRAVENOUS

## 2017-01-20 MED ORDER — METHOCARBAMOL 1000 MG/10ML IJ SOLN
500.0000 mg | Freq: Four times a day (QID) | INTRAVENOUS | Status: DC | PRN
  Filled 2017-01-20: qty 5

## 2017-01-20 MED ORDER — SUGAMMADEX SODIUM 200 MG/2ML IV SOLN
INTRAVENOUS | Status: AC
  Filled 2017-01-20: qty 2

## 2017-01-20 MED ORDER — BUPIVACAINE HCL (PF) 0.5 % IJ SOLN
INTRAMUSCULAR | Status: AC
  Filled 2017-01-20: qty 30

## 2017-01-20 MED ORDER — THROMBIN 20000 UNITS EX SOLR
CUTANEOUS | Status: DC | PRN
  Administered 2017-01-20: 20 mL via TOPICAL

## 2017-01-20 MED ORDER — POLYETHYLENE GLYCOL 3350 17 G PO PACK: 17.0000 g | PACK | Freq: Every day | ORAL | Status: DC | PRN

## 2017-01-20 MED ORDER — FENTANYL CITRATE (PF) 100 MCG/2ML IJ SOLN
INTRAMUSCULAR | Status: DC | PRN
  Administered 2017-01-20 (×2): 25 ug via INTRAVENOUS
  Administered 2017-01-20: 50 ug via INTRAVENOUS
  Administered 2017-01-20 (×2): 25 ug via INTRAVENOUS
  Administered 2017-01-20: 50 ug via INTRAVENOUS
  Administered 2017-01-20: 25 ug via INTRAVENOUS
  Administered 2017-01-20: 100 ug via INTRAVENOUS
  Administered 2017-01-20: 25 ug via INTRAVENOUS

## 2017-01-20 MED ORDER — SODIUM CHLORIDE 0.9 % IV SOLN: INTRAVENOUS | Status: DC

## 2017-01-20 MED ORDER — GLIPIZIDE ER 5 MG PO TB24
5.0000 mg | ORAL_TABLET | Freq: Two times a day (BID) | ORAL | Status: DC
  Administered 2017-01-20 – 2017-01-21 (×2): 5 mg via ORAL
  Filled 2017-01-20 (×2): qty 1

## 2017-01-20 MED ORDER — SODIUM CHLORIDE 0.9% FLUSH
3.0000 mL | Freq: Two times a day (BID) | INTRAVENOUS | Status: DC
  Administered 2017-01-20: 3 mL via INTRAVENOUS

## 2017-01-20 MED ORDER — FLEET ENEMA 7-19 GM/118ML RE ENEM: 1.0000 | ENEMA | Freq: Once | RECTAL | Status: DC | PRN

## 2017-01-20 MED ORDER — THROMBIN 20000 UNITS EX SOLR
CUTANEOUS | Status: AC
  Filled 2017-01-20: qty 20000

## 2017-01-20 MED ORDER — PHENYLEPHRINE HCL 10 MG/ML IJ SOLN
INTRAMUSCULAR | Status: DC | PRN
  Administered 2017-01-20 (×2): 80 ug via INTRAVENOUS

## 2017-01-20 MED ORDER — PREGABALIN 75 MG PO CAPS
150.0000 mg | ORAL_CAPSULE | Freq: Two times a day (BID) | ORAL | Status: DC
  Administered 2017-01-20 – 2017-01-21 (×2): 150 mg via ORAL
  Filled 2017-01-20 (×2): qty 2

## 2017-01-20 MED ORDER — ONDANSETRON HCL 4 MG/2ML IJ SOLN
INTRAMUSCULAR | Status: DC | PRN
  Administered 2017-01-20: 4 mg via INTRAVENOUS

## 2017-01-20 MED ORDER — PROMETHAZINE HCL 25 MG/ML IJ SOLN: 6.2500 mg | INTRAMUSCULAR | Status: DC | PRN

## 2017-01-20 MED ORDER — EPHEDRINE 5 MG/ML INJ
INTRAVENOUS | Status: AC
  Filled 2017-01-20: qty 10

## 2017-01-20 MED ORDER — PROPOFOL 10 MG/ML IV BOLUS
INTRAVENOUS | Status: AC
  Filled 2017-01-20: qty 20

## 2017-01-20 MED ORDER — ALBUMIN HUMAN 5 % IV SOLN
INTRAVENOUS | Status: DC | PRN
  Administered 2017-01-20: 14:00:00 via INTRAVENOUS

## 2017-01-20 MED ORDER — BUPIVACAINE HCL 0.5 % IJ SOLN
INTRAMUSCULAR | Status: DC | PRN
  Administered 2017-01-20: 4 mL

## 2017-01-20 MED ORDER — PHENYLEPHRINE HCL 10 MG/ML IJ SOLN
INTRAVENOUS | Status: DC | PRN
  Administered 2017-01-20: 25 ug/min via INTRAVENOUS

## 2017-01-20 MED ORDER — MENTHOL 3 MG MT LOZG: 1.0000 | LOZENGE | OROMUCOSAL | Status: DC | PRN

## 2017-01-20 MED ORDER — PHENYLEPHRINE 40 MCG/ML (10ML) SYRINGE FOR IV PUSH (FOR BLOOD PRESSURE SUPPORT)
PREFILLED_SYRINGE | INTRAVENOUS | Status: AC
  Filled 2017-01-20: qty 10

## 2017-01-20 MED ORDER — HYDROMORPHONE HCL 1 MG/ML IJ SOLN
0.2500 mg | INTRAMUSCULAR | Status: DC | PRN
  Administered 2017-01-20 (×2): 0.5 mg via INTRAVENOUS

## 2017-01-20 MED ORDER — SENNA 8.6 MG PO TABS
1.0000 | ORAL_TABLET | Freq: Two times a day (BID) | ORAL | Status: DC
  Administered 2017-01-20 – 2017-01-21 (×2): 8.6 mg via ORAL
  Filled 2017-01-20 (×2): qty 1

## 2017-01-20 MED ORDER — THROMBIN 5000 UNITS EX SOLR
CUTANEOUS | Status: DC | PRN
  Administered 2017-01-20: 5 mL via TOPICAL

## 2017-01-20 MED ORDER — ROCURONIUM BROMIDE 100 MG/10ML IV SOLN
INTRAVENOUS | Status: DC | PRN
  Administered 2017-01-20: 10 mg via INTRAVENOUS
  Administered 2017-01-20: 70 mg via INTRAVENOUS

## 2017-01-20 MED ORDER — SODIUM CHLORIDE 0.9 % IR SOLN
Status: DC | PRN
  Administered 2017-01-20: 500 mL

## 2017-01-20 MED ORDER — MIDAZOLAM HCL 2 MG/2ML IJ SOLN
INTRAMUSCULAR | Status: AC
  Filled 2017-01-20: qty 2

## 2017-01-20 MED ORDER — PROPOFOL 10 MG/ML IV BOLUS
INTRAVENOUS | Status: DC | PRN
Start: 2017-01-20 — End: 2017-01-20
  Administered 2017-01-20: 140 mg via INTRAVENOUS

## 2017-01-20 MED ORDER — ONDANSETRON HCL 4 MG/2ML IJ SOLN
INTRAMUSCULAR | Status: AC
  Filled 2017-01-20: qty 2

## 2017-01-20 MED ORDER — METHOCARBAMOL 500 MG PO TABS
500.0000 mg | ORAL_TABLET | Freq: Four times a day (QID) | ORAL | Status: DC | PRN
  Administered 2017-01-20 – 2017-01-21 (×3): 500 mg via ORAL
  Filled 2017-01-20 (×3): qty 1

## 2017-01-20 MED ORDER — LIDOCAINE-EPINEPHRINE 1 %-1:100000 IJ SOLN
INTRAMUSCULAR | Status: DC | PRN
  Administered 2017-01-20: 4 mL

## 2017-01-20 MED ORDER — ROCURONIUM BROMIDE 10 MG/ML (PF) SYRINGE
PREFILLED_SYRINGE | INTRAVENOUS | Status: AC
  Filled 2017-01-20: qty 5

## 2017-01-20 MED ORDER — DOCUSATE SODIUM 100 MG PO CAPS
100.0000 mg | ORAL_CAPSULE | Freq: Two times a day (BID) | ORAL | Status: DC
  Administered 2017-01-20 – 2017-01-21 (×2): 100 mg via ORAL
  Filled 2017-01-20 (×2): qty 1

## 2017-01-20 MED ORDER — METFORMIN HCL 500 MG PO TABS
1000.0000 mg | ORAL_TABLET | Freq: Two times a day (BID) | ORAL | Status: DC
  Administered 2017-01-20 – 2017-01-21 (×2): 1000 mg via ORAL
  Filled 2017-01-20 (×2): qty 2

## 2017-01-20 MED ORDER — CEFAZOLIN SODIUM-DEXTROSE 2-4 GM/100ML-% IV SOLN
2.0000 g | INTRAVENOUS | Status: AC
  Administered 2017-01-20: 2 g via INTRAVENOUS
  Filled 2017-01-20: qty 100

## 2017-01-20 MED ORDER — ACETAMINOPHEN 650 MG RE SUPP: 650.0000 mg | RECTAL | Status: DC | PRN

## 2017-01-20 MED ORDER — OXYCODONE HCL 5 MG PO TABS
5.0000 mg | ORAL_TABLET | ORAL | Status: DC | PRN
  Administered 2017-01-20 – 2017-01-21 (×6): 10 mg via ORAL
  Filled 2017-01-20 (×6): qty 2

## 2017-01-20 MED ORDER — INSULIN ASPART 100 UNIT/ML ~~LOC~~ SOLN
0.0000 [IU] | Freq: Three times a day (TID) | SUBCUTANEOUS | Status: DC
  Administered 2017-01-21: 3 [IU] via SUBCUTANEOUS

## 2017-01-20 MED ORDER — LIDOCAINE-EPINEPHRINE 1 %-1:100000 IJ SOLN
INTRAMUSCULAR | Status: AC
  Filled 2017-01-20: qty 1

## 2017-01-20 MED ORDER — SCOPOLAMINE 1 MG/3DAYS TD PT72: 1.0000 | MEDICATED_PATCH | TRANSDERMAL | Status: DC

## 2017-01-20 MED ORDER — FENTANYL CITRATE (PF) 250 MCG/5ML IJ SOLN
INTRAMUSCULAR | Status: AC
  Filled 2017-01-20: qty 5

## 2017-01-20 MED ORDER — THROMBIN 5000 UNITS EX SOLR
CUTANEOUS | Status: AC
  Filled 2017-01-20: qty 5000

## 2017-01-20 MED ORDER — LIDOCAINE HCL (CARDIAC) 20 MG/ML IV SOLN
INTRAVENOUS | Status: DC | PRN
  Administered 2017-01-20: 20 mg via INTRATRACHEAL

## 2017-01-20 MED ORDER — HYDROMORPHONE HCL 1 MG/ML IJ SOLN
INTRAMUSCULAR | Status: AC
  Administered 2017-01-20: 0.5 mg via INTRAVENOUS
  Filled 2017-01-20: qty 1

## 2017-01-20 MED ORDER — BISACODYL 10 MG RE SUPP: 10.0000 mg | Freq: Every day | RECTAL | Status: DC | PRN

## 2017-01-20 MED ORDER — LACTATED RINGERS IV SOLN
INTRAVENOUS | Status: DC
  Administered 2017-01-20 (×2): via INTRAVENOUS

## 2017-01-20 MED ORDER — SODIUM CHLORIDE 0.9% FLUSH: 3.0000 mL | INTRAVENOUS | Status: DC | PRN

## 2017-01-20 MED ORDER — PANTOPRAZOLE SODIUM 40 MG IV SOLR
40.0000 mg | Freq: Every day | INTRAVENOUS | Status: DC
  Administered 2017-01-20: 40 mg via INTRAVENOUS
  Filled 2017-01-20: qty 40

## 2017-01-20 MED ORDER — ONDANSETRON HCL 4 MG/2ML IJ SOLN: 4.0000 mg | Freq: Four times a day (QID) | INTRAMUSCULAR | Status: DC | PRN

## 2017-01-20 MED ORDER — 0.9 % SODIUM CHLORIDE (POUR BTL) OPTIME
TOPICAL | Status: DC | PRN
  Administered 2017-01-20: 1000 mL

## 2017-01-20 MED ORDER — LIDOCAINE 2% (20 MG/ML) 5 ML SYRINGE
INTRAMUSCULAR | Status: AC
  Filled 2017-01-20: qty 5

## 2017-01-20 SURGICAL SUPPLY — 71 items
BAG DECANTER FOR FLEXI CONT (MISCELLANEOUS) ×2 IMPLANT
BASKET BONE COLLECTION (BASKET) ×2 IMPLANT
BENZOIN TINCTURE PRP APPL 2/3 (GAUZE/BANDAGES/DRESSINGS) IMPLANT
BLADE CLIPPER SURG (BLADE) ×2 IMPLANT
BLADE SURG 11 STRL SS (BLADE) ×2 IMPLANT
BLADE ULTRA TIP 2M (BLADE) IMPLANT
BNDG GAUZE ELAST 4 BULKY (GAUZE/BANDAGES/DRESSINGS) IMPLANT
BUR MATCHSTICK NEURO 3.0 LAGG (BURR) ×2 IMPLANT
BUR ROUND FLUTED 4 SOFT TCH (BURR) ×2 IMPLANT
CAGE PEEK 8X14X11 (Cage) ×2 IMPLANT
CANISTER SUCT 3000ML PPV (MISCELLANEOUS) ×2 IMPLANT
CARTRIDGE OIL MAESTRO DRILL (MISCELLANEOUS) ×1 IMPLANT
DECANTER SPIKE VIAL GLASS SM (MISCELLANEOUS) IMPLANT
DERMABOND ADVANCED (GAUZE/BANDAGES/DRESSINGS) ×1
DERMABOND ADVANCED .7 DNX12 (GAUZE/BANDAGES/DRESSINGS) ×1 IMPLANT
DIFFUSER DRILL AIR PNEUMATIC (MISCELLANEOUS) ×2 IMPLANT
DRAIN CHANNEL 10M FLAT 3/4 FLT (DRAIN) IMPLANT
DRAPE C-ARM 42X72 X-RAY (DRAPES) ×4 IMPLANT
DRAPE HALF SHEET 40X57 (DRAPES) ×2 IMPLANT
DRAPE LAPAROTOMY 100X72 PEDS (DRAPES) ×2 IMPLANT
DRAPE MICROSCOPE LEICA (MISCELLANEOUS) ×2 IMPLANT
DRAPE POUCH INSTRU U-SHP 10X18 (DRAPES) ×2 IMPLANT
DRSG OPSITE POSTOP 3X4 (GAUZE/BANDAGES/DRESSINGS) ×4 IMPLANT
DRSG OPSITE POSTOP 4X6 (GAUZE/BANDAGES/DRESSINGS) ×2 IMPLANT
DURAPREP 6ML APPLICATOR 50/CS (WOUND CARE) ×2 IMPLANT
ELECT COATED BLADE 2.86 ST (ELECTRODE) ×2 IMPLANT
ELECT REM PT RETURN 9FT ADLT (ELECTROSURGICAL) ×2
ELECTRODE REM PT RTRN 9FT ADLT (ELECTROSURGICAL) ×1 IMPLANT
EVACUATOR SILICONE 100CC (DRAIN) IMPLANT
GAUZE SPONGE 4X4 16PLY XRAY LF (GAUZE/BANDAGES/DRESSINGS) IMPLANT
GLOVE BIO SURGEON STRL SZ7 (GLOVE) IMPLANT
GLOVE BIOGEL PI IND STRL 7.0 (GLOVE) ×1 IMPLANT
GLOVE BIOGEL PI IND STRL 7.5 (GLOVE) IMPLANT
GLOVE BIOGEL PI INDICATOR 7.0 (GLOVE) ×1
GLOVE BIOGEL PI INDICATOR 7.5 (GLOVE)
GLOVE ECLIPSE 7.0 STRL STRAW (GLOVE) ×2 IMPLANT
GLOVE EXAM NITRILE LRG STRL (GLOVE) IMPLANT
GLOVE EXAM NITRILE XL STR (GLOVE) IMPLANT
GLOVE EXAM NITRILE XS STR PU (GLOVE) IMPLANT
GLOVE SURG SS PI 7.5 STRL IVOR (GLOVE) ×4 IMPLANT
GOWN STRL REUS W/ TWL LRG LVL3 (GOWN DISPOSABLE) ×2 IMPLANT
GOWN STRL REUS W/ TWL XL LVL3 (GOWN DISPOSABLE) IMPLANT
GOWN STRL REUS W/TWL 2XL LVL3 (GOWN DISPOSABLE) IMPLANT
GOWN STRL REUS W/TWL LRG LVL3 (GOWN DISPOSABLE) ×2
GOWN STRL REUS W/TWL XL LVL3 (GOWN DISPOSABLE)
HEMOSTAT POWDER KIT SURGIFOAM (HEMOSTASIS) ×2 IMPLANT
KIT BASIN OR (CUSTOM PROCEDURE TRAY) ×2 IMPLANT
KIT ROOM TURNOVER OR (KITS) ×2 IMPLANT
NEEDLE HYPO 25X1 1.5 SAFETY (NEEDLE) ×2 IMPLANT
NEEDLE SPNL 22GX3.5 QUINCKE BK (NEEDLE) ×2 IMPLANT
NS IRRIG 1000ML POUR BTL (IV SOLUTION) ×2 IMPLANT
OIL CARTRIDGE MAESTRO DRILL (MISCELLANEOUS) ×2
PACK LAMINECTOMY NEURO (CUSTOM PROCEDURE TRAY) ×2 IMPLANT
PAD ARMBOARD 7.5X6 YLW CONV (MISCELLANEOUS) ×10 IMPLANT
PEEK ANATOMIC STRUT 5X14X11MM (Peek) ×2 IMPLANT
PEEK CAGE 8X14X11 (Peek) ×2 IMPLANT
PLATE 2 40XLCK NS SPNE CVD (Plate) ×1 IMPLANT
PLATE 2 ATLANTIS TRANS (Plate) ×1 IMPLANT
RUBBERBAND STERILE (MISCELLANEOUS) ×4 IMPLANT
SCREW SELF TAP VAR 4.0X13 (Screw) ×8 IMPLANT
SPONGE INTESTINAL PEANUT (DISPOSABLE) ×2 IMPLANT
SPONGE SURGIFOAM ABS GEL 100 (HEMOSTASIS) ×2 IMPLANT
STRIP CLOSURE SKIN 1/2X4 (GAUZE/BANDAGES/DRESSINGS) IMPLANT
SUT ETHILON 3 0 FSL (SUTURE) IMPLANT
SUT VIC AB 3-0 SH 8-18 (SUTURE) ×2 IMPLANT
SUT VICRYL 3-0 RB1 18 ABS (SUTURE) ×4 IMPLANT
SYR BULB 3OZ (MISCELLANEOUS) ×2 IMPLANT
TOWEL GREEN STERILE (TOWEL DISPOSABLE) ×2 IMPLANT
TOWEL GREEN STERILE FF (TOWEL DISPOSABLE) ×2 IMPLANT
TRAP SPECIMEN MUCOUS 40CC (MISCELLANEOUS) IMPLANT
WATER STERILE IRR 1000ML POUR (IV SOLUTION) ×2 IMPLANT

## 2017-01-20 NOTE — Anesthesia Preprocedure Evaluation (Addendum)
Anesthesia Evaluation  Patient identified by MRN, date of birth, ID band Patient awake    Reviewed: Allergy & Precautions, NPO status , Patient's Chart, lab work & pertinent test results  History of Anesthesia Complications Negative for: history of anesthetic complications  Airway Mallampati: I  TM Distance: >3 FB Neck ROM: Full    Dental  (+) Edentulous Upper, Edentulous Lower, Dental Advisory Given   Pulmonary Current Smoker,    Pulmonary exam normal        Cardiovascular hypertension, Pt. on medications Normal cardiovascular exam     Neuro/Psych negative psych ROS   GI/Hepatic negative GI ROS, Neg liver ROS,   Endo/Other  diabetes, Oral Hypoglycemic Agents  Renal/GU      Musculoskeletal   Abdominal   Peds  Hematology   Anesthesia Other Findings   Reproductive/Obstetrics                            Anesthesia Physical Anesthesia Plan  ASA: II  Anesthesia Plan: General   Post-op Pain Management:    Induction: Intravenous  PONV Risk Score and Plan: 2 and Ondansetron and Dexamethasone  Airway Management Planned: Oral ETT  Additional Equipment:   Intra-op Plan:   Post-operative Plan: Extubation in OR  Informed Consent: I have reviewed the patients History and Physical, chart, labs and discussed the procedure including the risks, benefits and alternatives for the proposed anesthesia with the patient or authorized representative who has indicated his/her understanding and acceptance.   Dental advisory given  Plan Discussed with: CRNA and Anesthesiologist  Anesthesia Plan Comments:       Anesthesia Quick Evaluation

## 2017-01-20 NOTE — Anesthesia Procedure Notes (Signed)
Procedure Name: Intubation Date/Time: 01/20/2017 12:42 PM Performed by: Shirlyn Goltz Pre-anesthesia Checklist: Patient identified, Emergency Drugs available, Patient being monitored and Suction available Patient Re-evaluated:Patient Re-evaluated prior to induction Oxygen Delivery Method: Circle system utilized Preoxygenation: Pre-oxygenation with 100% oxygen Induction Type: IV induction Ventilation: Mask ventilation without difficulty Laryngoscope Size: Mac and 3 Grade View: Grade II Tube type: Oral Tube size: 7.5 mm Number of attempts: 1 Airway Equipment and Method: Stylet and LTA kit utilized Placement Confirmation: ETT inserted through vocal cords under direct vision,  positive ETCO2 and breath sounds checked- equal and bilateral Secured at: 21 cm Tube secured with: Tape Dental Injury: Teeth and Oropharynx as per pre-operative assessment

## 2017-01-20 NOTE — Anesthesia Postprocedure Evaluation (Signed)
Anesthesia Post Note  Patient: Ryan King  Procedure(s) Performed: Procedure(s) (LRB): Anterior Cervical Discectomy Fusion - Cervical three-Cervical four - Cervical four-Cervical five, possible Cervical four Corpectomy (N/A)     Patient location during evaluation: PACU Anesthesia Type: General Level of consciousness: sedated Pain management: pain level controlled Vital Signs Assessment: post-procedure vital signs reviewed and stable Respiratory status: spontaneous breathing and respiratory function stable Cardiovascular status: stable Anesthetic complications: no    Last Vitals:  Vitals:   01/20/17 1612 01/20/17 1625  BP: 129/71 122/74  Pulse: 88 73  Resp: 17 12  Temp:  37 C    Last Pain:  Vitals:   01/20/17 1612  TempSrc:   PainSc: 4                  Tymeir Weathington DANIEL

## 2017-01-20 NOTE — H&P (Signed)
Chief Complaint  Neck pain, arm and leg numbness, tingling, weakness  History of Present Illness  Ryan King is a 61 y.o. male followed in the outpatient clinic with neck pain, and bilateral arm pain with numbness and tingling. He also c/o weakness in both legs and difficulty walking. 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  Past Medical History   Past Medical History:  Diagnosis Date  . Arthritis   . Essential hypertension   . Irregular heart rate    saw Dr. Rozann Lesches 10/2016; PACs, brief PAT on Holter  . Type 2 diabetes mellitus with diabetic neuropathy ([place])    type 2    Past Surgical History   Past Surgical History:  Procedure Laterality Date  . Arm Surgery Right 06/2016  . COLONOSCOPY N/A 10/18/2016   Procedure: COLONOSCOPY;  Surgeon: Daneil Dolin, MD;  Location: [facility];  Service: Endoscopy;  Laterality: N/A;  10:00 AM  . EYE SURGERY     right eye cataract  . FRACTURE SURGERY Right 06/2016    Social History   Social History  Substance Use Topics  . Smoking status: Current Every Day Smoker    Packs/day: 1.00    Years: 45.00    Types: Cigarettes  . Smokeless tobacco: Never Used  . Alcohol use Yes     Comment: Rarely-Once month    Medications   Prior to Admission medications   Medication Sig Start Date End Date Taking? Authorizing Provider  glipiZIDE (GLUCOTROL XL) 5 MG 24 hr tablet Take 5 mg by mouth 2 (two) times daily.   Yes [provider]  metFORMIN (GLUCOPHAGE) 1000 MG tablet Take 1,000 mg by mouth 2 (two) times daily with a meal.   Yes [provider]  pregabalin (LYRICA) 150 MG capsule Take 150 mg by mouth 2 (two) times daily.   Yes [provider]  polyethylene glycol-electrolytes (TRILYTE) 420 g solution Take 4,000 mLs by mouth as directed. Patient not taking: Reported on 01/13/2017 09/14/16   Rourk, Cristopher Estimable, MD    Allergies  No Known  Allergies  Review of Systems  ROS  Neurologic Exam  Awake, alert, oriented Memory and concentration grossly intact Speech fluent, appropriate CN grossly intact Motor exam: Upper Extremities Deltoid Bicep Tricep Grip  Right 4/5 -->     Left 4/5 -->      Lower Extremities IP Quad PF DF EHL  Right 4/5 -->      Left 4/5 -->       (+) Hoffman's bilaterally  Imaging  MRI demonstrates severe central stenosis due to disc/osteophyte complex at C3-4 and C4-5  Impression  - 61 y.o. male with cervical myelopathy and stenosis at C3-4 and C4-5.  Plan  - Proceed with ACDF C3-4, C4-5, possible corpectomy  I have reviewed with the patient the indications, risks, benefits, and alternatives to surgery in the office. All questions were answered and informed consent was obtained.

## 2017-01-20 NOTE — Transfer of Care (Signed)
Immediate Anesthesia Transfer of Care Note  Patient: Ryan King  Procedure(s) Performed: Procedure(s): Anterior Cervical Discectomy Fusion - Cervical three-Cervical four - Cervical four-Cervical five, possible Cervical four Corpectomy (N/A)  Patient Location: PACU  Anesthesia Type:General  Level of Consciousness: awake, alert , oriented and patient cooperative  Airway & Oxygen Therapy: Patient Spontanous Breathing and Patient connected to nasal cannula oxygen  Post-op Assessment: Report given to RN and Post -op Vital signs reviewed and stable  Post vital signs: Reviewed and stable  Last Vitals:  Vitals:   01/20/17 1040 01/20/17 1542  BP: (!) 152/75 (!) 158/61  Pulse: 60 87  Resp: 18 15  Temp: 36.8 C 37.1 C    Last Pain:  Vitals:   01/20/17 1050  TempSrc:   PainSc: 4       Patients Stated Pain Goal: 2 (59/16/38 4665)  Complications: No apparent anesthesia complications

## 2017-01-20 NOTE — Op Note (Signed)
PREOP DIAGNOSIS:  Cervical disc disease with myelopathy, C3-4, C4-5  POSTOP DIAGNOSIS:  Same  PROCEDURE: 1. Complete (>50%) corpectomy at C4 for decompression of spinal cord and exiting nerve roots  2. Placement of intervertebral biomechanical device, 27mm [employer] PEEK graft 3. Placement of anterior instrumentation consisting of interbody plate and screws spanning C3-C5 4. Use of morselized bone autograft 5. Arthrodesis C3-C5, anterior interbody technique  6. Use of intraoperative microscope  SURGEON: Dr. Consuella Lose, MD  ASSISTANT: Dr. Newman Pies, MD  ANESTHESIA: General Endotracheal  EBL: 100cc  SPECIMENS: None  DRAINS: None  COMPLICATIONS: None  CONDITION: Stable to PACU  HISTORY: Ryan King is a 61 y.o. initially seen in the outpatient neurosurgery clinic with neck pain, arm pain, and bilateral leg weakness and difficulty walking. His MRI demonstrated severe stenosis at C3-4 and C4-5. He therefore elected to proceed with surgical decompression and fusion. The risks and benefits of the surgery were expanded detail the patient. After all his questions were answered, informed consent was obtained and witnessed.  PROCEDURE IN DETAIL: The patient was brought to the operating room and transferred to the operative table. After induction of general anesthesia, the patient was positioned on the operative table in the supine position with all pressure points meticulously padded. The skin of the neck was then prepped and draped in the usual sterile fashion.  After timeout was conducted, the skin was infiltrated with local anesthetic. Skin incision was then made sharply and Bovie electrocautery was used to dissect the subcutaneous tissue until the platysma was identified. The platysma was then divided and undermined. The sternocleidomastoid muscle was then identified and, utilizing natural fascial planes in the neck, the prevertebral fascia was identified and the carotid  sheath was retracted laterally and the trachea and esophagus retracted medially. Again using fluoroscopy, the correct disc spaces were identified. Bovie electrocautery was used to dissect in the subperiosteal plane and elevate the bilateral longus coli muscles. Table mounted retractors were then placed. At this point, the microscope was draped and brought into the field, and the remainder of the case was done under the microscope using microdissecting technique.  The C3-4 disc space was incised sharply and rongeurs were use to initially complete a discectomy. The high-speed drill was then used to complete discectomy until the posterior annulus was identified. C4-5 disc space was similarly incised, and high-speed drill was used to complete superficial discectomy. The posterior annulus was identified and removed, and the PLL was identified. At this point, the C4 vertebral body was drilled out, and the bone was collected for use as autograft. Using a nerve hook, the posterior longitudinal ligament was identified and elevated, and Kerrison rongeurs were used to remove it. I did note a significant amount of thickened ligament and posterior osteophytosis at the level of C3-4 and C4-5. Once the PLL was removed, the thecal sac appeared to be well decompressed. Kerrison rongeurs were used to remove osteophytes from the posterior inferior aspect of C3, as well as the superior posterior aspect of C5. At this point, hemostasis was secured with a combination of bipolar electrocautery and morcellized Gelfoam and thrombin. Calipers were used to size a 21 mm graft. This was packed with the previously collected bone autograft. This was then tapped in place and position was confirmed with intraoperative fluoroscopy. Translational plate was then selected and placed across the interspace, and secured with screws in C3 and C5. Final lateral fluoroscopy demonstrated good position of the graft.  At this point, the wound  was irrigated  with copious amounts of normal saline irrigation. Hemostasis was secured with combination of bipolar electrocautery and morcellized Gelfoam and thrombin. The wound was then closed in standard fashion with interrupted 3-0 Vicryl stitches in the platysma, and interrupted 3-0 Vicryl stitches in the subcuticular space. At the end of the case all sponge, needle, instrument, and cottonoid counts were correct. A sterile dressing was applied, and the patient was transferred to the stretcher, extubated, and taken to the postanesthesia care unit in stable hemodynamic condition.

## 2017-01-21 LAB — GLUCOSE, CAPILLARY: GLUCOSE-CAPILLARY: 167 mg/dL — AB (ref 65–99)

## 2017-01-21 MED ORDER — OXYCODONE-ACETAMINOPHEN 7.5-325 MG PO TABS: 1.0000 | ORAL_TABLET | ORAL | 0 refills | Status: DC | PRN

## 2017-01-21 MED ORDER — METHOCARBAMOL 500 MG PO TABS: 500.0000 mg | ORAL_TABLET | Freq: Four times a day (QID) | ORAL | 2 refills | Status: DC | PRN

## 2017-01-21 NOTE — Care Management Note (Signed)
Case Management Note  Patient Details  Name: Ryan King MRN: [id number] Date of Birth: 12-27-1955  Subjective/Objective:                 Patient with order to DC to home today. Chart reviewed. No Home Health or Equipment needs, no unacknowledged Case Management consults or medication needs identified at the time of this note. Plan for DC to home. If needs arise today prior to discharge, please call Carles Collet RN CM at [phone].   Action/Plan:   Expected Discharge Date:  01/21/17               Expected Discharge Plan:  Home/Self Care  In-House Referral:     Discharge planning Services  CM Consult  Post Acute Care Choice:    Choice offered to:     DME Arranged:    DME Agency:     HH Arranged:    HH Agency:     Status of Service:  Completed, signed off  If discussed at [employer] of Stay Meetings, dates discussed:    Additional Comments:  Carles Collet, RN 01/21/2017, 9:07 AM

## 2017-01-21 NOTE — Progress Notes (Signed)
Patient is discharged from room 3C08 at this time. Alert and in stable condition. IV site d/c'd and instructions read to patient with understanding verbalized. Left unit via wheelchair with all belongings at side. 

## 2017-01-21 NOTE — Evaluation (Signed)
Occupational Therapy Evaluation Patient Details Name: Ryan King MRN: [id number] DOB: 04-26-1956 Today's Date: 01/21/2017    History of Present Illness 61 y.o.malefollowed in the outpatient clinic with neck pain, and bilateral arm pain with numbness and tingling. He also c/o weakness in both legs and difficulty walking. 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 S/p Arthrodesis C3-C5 on 01/20/17. PHM inclduing DM type 2 with diabetic neuropathy, arthritis, and HTN.    Clinical Impression   PTA, pt was living with his girlfriend and performing his ADLs and IADLs. Pt currently requires Min A for UB ADLs and Min Guard A for LB ADLs and functional mobility with RW. Educated pt on cervical precautions and adherence during ADLs; pt demonstrating understanding with Min VCs. Recommend dc home once medically stable per physical with initial 24 hour supervision to increase safety at home. Provided all education and answered pt's questions in preparation for dc today. All acute OT needs met and will sign off. Thank you.     Follow Up Recommendations  DC plan and follow up therapy as arranged by surgeon;Supervision/Assistance - 24 hour    Equipment Recommendations  3 in 1 bedside commode    Recommendations for Other Services PT consult     Precautions / Restrictions Precautions Precautions: Cervical;Back Precaution Booklet Issued: Yes (comment) Precaution Comments: Reviewed handout in full and educated on application for ADLs Required Braces or Orthoses: Cervical Brace Cervical Brace: Hard collar;At all times;Other (comment) (Off for showers) Restrictions Weight Bearing Restrictions: No      Mobility Bed Mobility               General bed mobility comments: Pt at EOB upon arrival. Educated pt on log roll technique and he verbalized step-by-step how to perform a log roll.   Transfers Overall transfer level:  Needs assistance Equipment used: Rolling walker (2 wheeled) Transfers: Sit to/from Stand Sit to Stand: Min guard         General transfer comment: Min Guard for safety    Balance Overall balance assessment: Modified Independent                                         ADL either performed or assessed with clinical judgement   ADL Overall ADL's : Needs assistance/impaired Eating/Feeding: Set up;Sitting   Grooming: Set up;Sitting   Upper Body Bathing: Minimal assistance;Sitting   Lower Body Bathing: Min guard;Sit to/from stand   Upper Body Dressing : Minimal assistance;Sitting Upper Body Dressing Details (indicate cue type and reason): Min A for donning/doffing cervical collar due to decreased finger dexerity. Pt follows cerivical precautions during donning of shirt.  Lower Body Dressing: Min guard;Sit to/from stand Lower Body Dressing Details (indicate cue type and reason): Pt donned pants, socks, and shoes with Min guard A for safety Toilet Transfer: Min guard;Ambulation;BSC;RW Toilet Transfer Details (indicate cue type and reason): Pt demonstrating decreased balance and strength during toilet transfer and would benefit from 3n1 at home to increase safety.        Tub/Shower Transfer Details (indicate cue type and reason): Educated pt on use of 3N1 in tub. Functional mobility during ADLs: Supervision/safety;Rolling walker General ADL Comments: Pt performing UB ADLs with Min A and  LB ADLs with Min guard A. Pt demonstrating understanding of education to adhere to precautions during ADLs. Requires increased time for  ADLs and functional mobility     Vision         Perception     Praxis      Pertinent Vitals/Pain Pain Assessment: Faces Faces Pain Scale: Hurts little more Pain Location: Neck/Back, Bil knees Pain Descriptors / Indicators: Constant;Discomfort;Grimacing Pain Intervention(s): Monitored during session;Repositioned     Hand Dominance  Right   Extremity/Trunk Assessment Upper Extremity Assessment Upper Extremity Assessment: Generalized weakness;RUE deficits/detail RUE Deficits / Details: PTA, pt reports he broke his humerous this back winter "during the first snow". Pt with decreased ROM and strength. Poor finger dexerity.  RUE:  (Unable to fully assess due to cervical precautions) RUE Sensation:  (Intact) RUE Coordination: decreased fine motor;decreased gross motor   Lower Extremity Assessment Lower Extremity Assessment: Defer to PT evaluation (Reports he has neuropathy in BLE)   Cervical / Trunk Assessment Cervical / Trunk Assessment: Other exceptions (s/p ) Cervical / Trunk Exceptions: s/p c3-5 fusion   Communication Communication Communication: No difficulties   Cognition Arousal/Alertness: Awake/alert Behavior During Therapy: WFL for tasks assessed/performed Overall Cognitive Status: Within Functional Limits for tasks assessed                                     General Comments  Pt asking about transportation for home. Notified RN.     Exercises     Shoulder Instructions      Home Living Family/patient expects to be discharged to:: Private residence Living Arrangements: Non-relatives/Friends Available Help at Discharge: Friend(s);Other (Comment) (Girlfriend, girlfriend's brother, and friends) Type of Home: House Home Access: Stairs to enter Technical brewer of Steps: 3 Entrance Stairs-Rails: None Home Layout: Multi-level Alternate Level Stairs-Number of Steps: 4 Alternate Level Stairs-Rails: None Bathroom Shower/Tub: Tub/shower unit;Curtain   Bathroom Toilet: Standard (Low)     Home Equipment: Hand held shower head          Prior Functioning/Environment Level of Independence: Independent        Comments: Independent with ADLs and IADLs. Not working.        OT Problem List: Decreased range of motion;Decreased activity tolerance;Impaired balance (sitting and/or  standing);Decreased strength;Decreased safety awareness;Decreased knowledge of use of DME or AE;Decreased knowledge of precautions;Pain      OT Treatment/Interventions:      OT Goals(Current goals can be found in the care plan section) Acute Rehab OT Goals Patient Stated Goal: Go home OT Goal Formulation: With patient Time For Goal Achievement: 02/04/17 Potential to Achieve Goals: Good  OT Frequency:     Barriers to D/C:            Co-evaluation              AM-PAC PT "6 Clicks" Daily Activity     Outcome Measure Help from another person eating meals?: None Help from another person taking care of personal grooming?: A Little Help from another person toileting, which includes using toliet, bedpan, or urinal?: A Little Help from another person bathing (including washing, rinsing, drying)?: A Little Help from another person to put on and taking off regular upper body clothing?: A Little Help from another person to put on and taking off regular lower body clothing?: A Little 6 Click Score: 19   End of Session Equipment Utilized During Treatment: Cervical collar;Rolling walker Nurse Communication: Mobility status  Activity Tolerance: Patient tolerated treatment well Patient left: in chair;with call bell/phone within reach  OT Visit Diagnosis:  Unsteadiness on feet (R26.81);Other abnormalities of gait and mobility (R26.89);Pain;Muscle weakness (generalized) (M62.81) Pain - Right/Left:  (Neck/back) Pain - part of body:  (Neck/back)                Time: 1884-1660 OT Time Calculation (min): 22 min Charges:  OT General Charges $OT Visit: 1 Procedure OT Evaluation $OT Eval Low Complexity: 1 Procedure G-Codes:     [place], OTR/L Acute Rehab Pager: [phone] Office: [place] 01/21/2017, 9:31 AM

## 2017-01-21 NOTE — Discharge Summary (Signed)
Physician Discharge Summary  Patient ID: Ryan King MRN: [id number] DOB/AGE: [birth year] 61 y.o.  Admit date: 01/20/2017 Discharge date: 01/21/2017  Admission Diagnoses:  Cervical disease with myelopathy  Discharge Diagnoses:  Same Active Problems:   Cervical disc disease with myelopathy   Discharged Condition: Stable  Hospital Course:  Ryan King is a 61 y.o. male who was admitted for the below procedure. There were no post operative complications. At time of discharge, pain was well controlled, ambulating, tolerating po, voiding normal. Ready for discharge.  Treatments: Surgery -  1. Complete (>50%) corpectomy at C4 for decompression of spinal cord and exiting nerve roots  2. Placement of intervertebral biomechanical device, 17mm [employer] PEEK graft 3. Placement of anterior instrumentation consisting of interbody plate and screws spanning C3-C5 4. Use of morselized bone autograft 5. Arthrodesis C3-C5, anterior interbody technique  6. Use of intraoperative microscope  Discharge Exam: Blood pressure (!) 170/79, pulse 70, temperature 99.5 F (37.5 C), temperature source Oral, resp. rate 18, height 5' 10.5" (1.791 m), weight 69.7 kg (153 lb 9.6 oz), SpO2 100 %. Awake, alert, oriented Speech fluent, appropriate CN grossly intact MAEW with good strength Wound c/d/i  Disposition: 01-Home or Self Care  Discharge Instructions    Call MD for:  difficulty breathing, headache or visual disturbances    Complete by:  As directed    Call MD for:  persistant dizziness or light-headedness    Complete by:  As directed    Call MD for:  redness, tenderness, or signs of infection (pain, swelling, redness, odor or green/yellow discharge around incision site)    Complete by:  As directed    Call MD for:  severe uncontrolled pain    Complete by:  As directed    Call MD for:  temperature >100.4    Complete by:  As directed    Diet general    Complete by:  As directed    Driving  Restrictions    Complete by:  As directed    Do not drive until given clearance.   Increase activity slowly    Complete by:  As directed    Lifting restrictions    Complete by:  As directed    Do not lift anything >10lbs. Avoid bending and twisting in awkward positions. Avoid bending at the back.   May shower / Bathe    Complete by:  As directed    In 24 hours. Okay to wash wound with warm soapy water. Avoid scrubbing the wound. Pat dry.   Remove dressing in 24 hours    Complete by:  As directed      Allergies as of 01/21/2017   No Known Allergies     Medication List    TAKE these medications   glipiZIDE 5 MG 24 hr tablet Commonly known as:  GLUCOTROL XL Take 5 mg by mouth 2 (two) times daily.   metFORMIN 1000 MG tablet Commonly known as:  GLUCOPHAGE Take 1,000 mg by mouth 2 (two) times daily with a meal.   methocarbamol 500 MG tablet Commonly known as:  ROBAXIN Take 1 tablet (500 mg total) by mouth every 6 (six) hours as needed for muscle spasms.   oxyCODONE-acetaminophen 7.5-325 MG tablet Commonly known as:  PERCOCET Take 1 tablet by mouth every 4 (four) hours as needed for severe pain.   polyethylene glycol-electrolytes 420 g solution Commonly known as:  TRILYTE Take 4,000 mLs by mouth as directed.   pregabalin 150 MG capsule Commonly known  as:  LYRICA Take 150 mg by mouth 2 (two) times daily.      Follow-up Information    Consuella Lose, MD. Schedule an appointment as soon as possible for a visit in 3 week(s).   Specialty:  Neurosurgery Why:  with Xrays Contact information: [address] [place] [address] [phone]           Signed: Traci Sermon 01/21/2017, 8:52 AM

## 2017-01-21 NOTE — Evaluation (Signed)
Physical Therapy Evaluation Patient Details Name: Ryan King MRN: [id number] DOB: [birth year] Today's Date: 01/21/2017   History of Present Illness  60 y.o.malewith neck pain, and bilateral arm pain with numbness and tingling. He also c/o weakness in both legs and difficulty walking. 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 S/p Arthrodesis C3-C5 on 01/20/17. PHM inclduing DM type 2 with diabetic neuropathy, arthritis, and HTN.   Clinical Impression  Patient is s/p above surgery. Pt ready for DC home and mainly concerned with getting a ride home. I have answered all patient's question regarding PT and mobility.   I have encouraged the patient to gradually increase activity daily to tolerance.  Pt feels ready for DC home. I adjusted his RW for home to correct height.        Follow Up Recommendations DC plan and follow up therapy as arranged by surgeon;Supervision for mobility/OOB    Equipment Recommendations  Rolling walker with 5" wheels    Recommendations for Other Services       Precautions / Restrictions Precautions Precautions: Cervical;Back Precaution Booklet Issued: Yes (comment) Precaution Comments: reviewed precautions Required Braces or Orthoses: Cervical Brace Cervical Brace: Hard collar;At all times;Other (comment) Restrictions Weight Bearing Restrictions: No      Mobility  Bed Mobility Overal bed mobility: Independent             General bed mobility comments: Pt at EOB upon arrival. Educated pt on log roll technique and he verbalized step-by-step how to perform a log roll.   Transfers Overall transfer level: Needs assistance Equipment used: Rolling walker (2 wheeled) Transfers: Sit to/from Stand Sit to Stand: Supervision         General transfer comment: able to get up and down without UE assist  Ambulation/Gait Ambulation/Gait assistance: Supervision Ambulation Distance  (Feet): 80 Feet Assistive device: Rolling walker (2 wheeled) Gait Pattern/deviations: Step-through pattern;Decreased stride length   Gait velocity interpretation: at or above normal speed for age/gender General Gait Details: Pt does not fully extend knees with gait  Stairs Stairs:  (Pt refused stair training. States he will have no issues. ) States he has longstanding LE issues and has figured out how to do the stairs.          Wheelchair Mobility    Modified Rankin (Stroke Patients Only)       Balance Overall balance assessment:  (Pt requires RW for gait. With LE neuropathies has increased risk for falls )                                           Pertinent Vitals/Pain Pain Assessment: 0-10 Pain Score: 3  Faces Pain Scale: Hurts little more Pain Location: Neck/Back, Bil knees Pain Descriptors / Indicators: Constant;Discomfort Pain Intervention(s): Monitored during session    Home Living Family/patient expects to be discharged to:: Private residence Living Arrangements: Non-relatives/Friends Available Help at Discharge: Friend(s);Other (Comment) Type of Home: House Home Access: Stairs to enter Entrance Stairs-Rails: Right Entrance Stairs-Number of Steps: 3 Home Layout: Multi-level Home Equipment: Hand held shower head      Prior Function Level of Independence: Independent         Comments: Independent with gait     Hand Dominance   Dominant Hand: Right    Extremity/Trunk Assessment   Upper Extremity Assessment Upper Extremity Assessment: Defer to  OT evaluation RUE Deficits / Details: PTA, pt reports he broke his humerous this back winter "during the first snow". Pt with decreased ROM and strength. Poor finger dexerity.  RUE:  (Unable to fully assess due to cervical precautions) RUE Sensation:  (Intact) RUE Coordination: decreased fine motor;decreased gross motor    Lower Extremity Assessment Lower Extremity Assessment: RLE  deficits/detail;LLE deficits/detail RLE Deficits / Details: Pt with h/o neuropathies RLE Sensation: history of peripheral neuropathy LLE Deficits / Details: h/o neuropathies LLE Sensation: history of peripheral neuropathy    Cervical / Trunk Assessment Cervical / Trunk Assessment: Other exceptions Cervical / Trunk Exceptions: s/p c3-5 fusion  Communication   Communication: No difficulties  Cognition Arousal/Alertness: Awake/alert Behavior During Therapy: WFL for tasks assessed/performed Overall Cognitive Status: Within Functional Limits for tasks assessed                                        General Comments General comments (skin integrity, edema, etc.): No family present. Pt preoccupied with getting a ride home from hospital and was not too interested in PT.    Exercises     Assessment/Plan    PT Assessment  (F/U per MD)  PT Problem List         PT Treatment Interventions      PT Goals (Current goals can be found in the Care Plan section)  Acute Rehab PT Goals Patient Stated Goal: Go home PT Goal Formulation: With patient    Frequency     Barriers to discharge        Co-evaluation               AM-PAC PT "6 Clicks" Daily Activity  Outcome Measure Difficulty turning over in bed (including adjusting bedclothes, sheets and blankets)?: A Little Difficulty moving from lying on back to sitting on the side of the bed? : A Little Difficulty sitting down on and standing up from a chair with arms (e.g., wheelchair, bedside commode, etc,.)?: A Little Help needed moving to and from a bed to chair (including a wheelchair)?: A Little Help needed walking in hospital room?: A Little Help needed climbing 3-5 steps with a railing? : A Little 6 Click Score: 18    End of Session Equipment Utilized During Treatment: Gait belt Activity Tolerance: Patient tolerated treatment well Patient left: in bed;with call bell/phone within reach Nurse Communication:  Mobility status PT Visit Diagnosis: Unsteadiness on feet (R26.81)    Time: 0955-1010 PT Time Calculation (min) (ACUTE ONLY): 15 min   Charges:   PT Evaluation $PT Eval Low Complexity: 1 Low     PT G CodesLavonia Dana, PT  [phone] 01/21/2017   Melvern Banker 01/21/2017, 10:24 AM

## 2017-01-23 ENCOUNTER — Encounter (HOSPITAL_COMMUNITY): Payer: Self-pay | Admitting: Neurosurgery

## 2017-04-19 NOTE — Patient Instructions (Signed)
Your procedure is scheduled on: 05/01/2017  Report to [facility] at  800   AM.  Call this number if you have problems the morning of surgery: [phone]   Do not eat food or drink liquids :After Midnight.      Take these medicines the morning of surgery with A SIP OF WATER: robaxin, oxycodone, lyrica. DO NOT take any medications for diabetes the morning of your surgery.   Do not wear jewelry, make-up or nail polish.  Do not wear lotions, powders, or perfumes. You may wear deodorant.  Do not shave 48 hours prior to surgery.  Do not bring valuables to the hospital.  Contacts, dentures or bridgework may not be worn into surgery.  Leave suitcase in the car. After surgery it may be brought to your room.  For patients admitted to the hospital, checkout time is 11:00 AM the day of discharge.   Patients discharged the day of surgery will not be allowed to drive home.  :     Please read over the following fact sheets that you were given: Coughing and Deep Breathing, Surgical Site Infection Prevention, Anesthesia Post-op Instructions and Care and Recovery After Surgery    Cataract A cataract is a clouding of the lens of the eye. When a lens becomes cloudy, vision is reduced based on the degree and nature of the clouding. Many cataracts reduce vision to some degree. Some cataracts make people more near-sighted as they develop. Other cataracts increase glare. Cataracts that are ignored and become worse can sometimes look white. The white color can be seen through the pupil. CAUSES   Aging. However, cataracts may occur at any age, even in newborns.   Certain drugs.   Trauma to the eye.   Certain diseases such as diabetes.   Specific eye diseases such as chronic inflammation inside the eye or a sudden attack of a rare form of glaucoma.   Inherited or acquired medical problems.  SYMPTOMS   Gradual, progressive drop in vision in the affected eye.   Severe, rapid visual loss. This most often  happens when trauma is the cause.  DIAGNOSIS  To detect a cataract, an eye doctor examines the lens. Cataracts are best diagnosed with an exam of the eyes with the pupils enlarged (dilated) by drops.  TREATMENT  For an early cataract, vision may improve by using different eyeglasses or stronger lighting. If that does not help your vision, surgery is the only effective treatment. A cataract needs to be surgically removed when vision loss interferes with your everyday activities, such as driving, reading, or watching TV. A cataract may also have to be removed if it prevents examination or treatment of another eye problem. Surgery removes the cloudy lens and usually replaces it with a substitute lens (intraocular lens, IOL).  At a time when both you and your doctor agree, the cataract will be surgically removed. If you have cataracts in both eyes, only one is usually removed at a time. This allows the operated eye to heal and be out of danger from any possible problems after surgery (such as infection or poor wound healing). In rare cases, a cataract may be doing damage to your eye. In these cases, your caregiver may advise surgical removal right away. The vast majority of people who have cataract surgery have better vision afterward. HOME CARE INSTRUCTIONS  If you are not planning surgery, you may be asked to do the following:  Use different eyeglasses.   Use stronger  or brighter lighting.   Ask your eye doctor about reducing your medicine dose or changing medicines if it is thought that a medicine caused your cataract. Changing medicines does not make the cataract go away on its own.   Become familiar with your surroundings. Poor vision can lead to injury. Avoid bumping into things on the affected side. You are at a higher risk for tripping or falling.   Exercise extreme care when driving or operating machinery.   Wear sunglasses if you are sensitive to bright light or experiencing problems with  glare.  SEEK IMMEDIATE MEDICAL CARE IF:   You have a worsening or sudden vision loss.   You notice redness, swelling, or increasing pain in the eye.   You have a fever.  Document Released: 06/06/2005 Document Revised: 05/26/2011 Document Reviewed: 01/28/2011 [facility] Patient Information 2012 [email or web address] INSTRUCTIONS POST-ANESTHESIA  IMMEDIATELY FOLLOWING SURGERY:  Do not drive or operate machinery for the first twenty four hours after surgery.  Do not make any important decisions for twenty four hours after surgery or while taking narcotic pain medications or sedatives.  If you develop intractable nausea and vomiting or a severe headache please notify your doctor immediately.  FOLLOW-UP:  Please make an appointment with your surgeon as instructed. You do not need to follow up with anesthesia unless specifically instructed to do so.  WOUND CARE INSTRUCTIONS (if applicable):  Keep a dry clean dressing on the anesthesia/puncture wound site if there is drainage.  Once the wound has quit draining you may leave it open to air.  Generally you should leave the bandage intact for twenty four hours unless there is drainage.  If the epidural site drains for more than 36-48 hours please call the anesthesia department.  QUESTIONS?:  Please feel free to call your physician or the hospital operator if you have any questions, and they will be happy to assist you.

## 2017-04-25 ENCOUNTER — Other Ambulatory Visit: Payer: Self-pay

## 2017-04-25 ENCOUNTER — Encounter (HOSPITAL_COMMUNITY)
Admission: RE | Admit: 2017-04-25 | Discharge: 2017-04-25 | Disposition: A | Discharge status: Home / Self Care | Payer: Medicaid Other | Source: Ambulatory Visit | Attending: Ophthalmology | Admitting: Ophthalmology

## 2017-04-25 ENCOUNTER — Encounter (HOSPITAL_COMMUNITY): Payer: Self-pay

## 2017-04-25 DIAGNOSIS — R9431 Abnormal electrocardiogram [ECG] [EKG]: Secondary | ICD-10-CM | POA: Insufficient documentation

## 2017-04-25 DIAGNOSIS — Z01818 Encounter for other preprocedural examination: Secondary | ICD-10-CM | POA: Diagnosis not present

## 2017-04-25 LAB — CBC WITH DIFFERENTIAL/PLATELET
Basophils Absolute: 0 10*3/uL (ref 0.0–0.1)
Basophils Relative: 0 %
EOS ABS: 0.2 10*3/uL (ref 0.0–0.7)
Eosinophils Relative: 3 %
HCT: 40.5 % (ref 39.0–52.0)
HEMOGLOBIN: 13.9 g/dL (ref 13.0–17.0)
Lymphocytes Relative: 46 %
Lymphs Abs: 3.9 10*3/uL (ref 0.7–4.0)
MCH: 30.4 pg (ref 26.0–34.0)
MCHC: 34.3 g/dL (ref 30.0–36.0)
MCV: 88.6 fL (ref 78.0–100.0)
Monocytes Absolute: 0.4 10*3/uL (ref 0.1–1.0)
Monocytes Relative: 5 %
NEUTROS PCT: 46 %
Neutro Abs: 4 10*3/uL (ref 1.7–7.7)
Platelets: 263 10*3/uL (ref 150–400)
RBC: 4.57 MIL/uL (ref 4.22–5.81)
RDW: 12.9 % (ref 11.5–15.5)
WBC: 8.5 10*3/uL (ref 4.0–10.5)

## 2017-04-25 LAB — BASIC METABOLIC PANEL
Anion gap: 9 (ref 5–15)
BUN: 10 mg/dL (ref 6–20)
CHLORIDE: 106 mmol/L (ref 101–111)
CO2: 23 mmol/L (ref 22–32)
CREATININE: 0.78 mg/dL (ref 0.61–1.24)
Calcium: 9.5 mg/dL (ref 8.9–10.3)
GLUCOSE: 308 mg/dL — AB (ref 65–99)
POTASSIUM: 3.9 mmol/L (ref 3.5–5.1)
SODIUM: 138 mmol/L (ref 135–145)

## 2017-04-25 LAB — HEMOGLOBIN A1C
HEMOGLOBIN A1C: 8.3 % — AB (ref 4.8–5.6)
Mean Plasma Glucose: 191.51 mg/dL

## 2017-04-25 LAB — GLUCOSE, CAPILLARY: GLUCOSE-CAPILLARY: 281 mg/dL — AB (ref 65–99)

## 2017-04-26 NOTE — Pre-Procedure Instructions (Signed)
hemA1C routed to PCP.

## 2017-05-01 ENCOUNTER — Encounter (HOSPITAL_COMMUNITY)
Admission: RE | Disposition: A | Discharge status: Home / Self Care | Payer: Self-pay | Source: Ambulatory Visit | Attending: Ophthalmology

## 2017-05-01 ENCOUNTER — Ambulatory Visit (HOSPITAL_COMMUNITY): Payer: Medicaid Other | Admitting: Anesthesiology

## 2017-05-01 ENCOUNTER — Encounter (HOSPITAL_COMMUNITY): Payer: Self-pay | Admitting: Ophthalmology

## 2017-05-01 ENCOUNTER — Ambulatory Visit (HOSPITAL_COMMUNITY)
Admission: RE | Admit: 2017-05-01 | Discharge: 2017-05-01 | Disposition: A | Discharge status: Home / Self Care | Payer: Medicaid Other | Source: Ambulatory Visit | Attending: Ophthalmology | Admitting: Ophthalmology

## 2017-05-01 DIAGNOSIS — E119 Type 2 diabetes mellitus without complications: Secondary | ICD-10-CM | POA: Diagnosis not present

## 2017-05-01 DIAGNOSIS — Z7984 Long term (current) use of oral hypoglycemic drugs: Secondary | ICD-10-CM | POA: Insufficient documentation

## 2017-05-01 DIAGNOSIS — H25812 Combined forms of age-related cataract, left eye: Secondary | ICD-10-CM | POA: Diagnosis present

## 2017-05-01 DIAGNOSIS — I10 Essential (primary) hypertension: Secondary | ICD-10-CM | POA: Insufficient documentation

## 2017-05-01 DIAGNOSIS — F17200 Nicotine dependence, unspecified, uncomplicated: Secondary | ICD-10-CM | POA: Diagnosis not present

## 2017-05-01 DIAGNOSIS — Z79899 Other long term (current) drug therapy: Secondary | ICD-10-CM | POA: Insufficient documentation

## 2017-05-01 HISTORY — PX: CATARACT EXTRACTION W/PHACO: SHX586

## 2017-05-01 LAB — GLUCOSE, CAPILLARY: Glucose-Capillary: 214 mg/dL — ABNORMAL HIGH (ref 65–99)

## 2017-05-01 SURGERY — PHACOEMULSIFICATION, CATARACT, WITH IOL INSERTION
Anesthesia: Monitor Anesthesia Care | Site: Eye | Laterality: Left

## 2017-05-01 MED ORDER — FENTANYL CITRATE (PF) 100 MCG/2ML IJ SOLN
25.0000 ug | Freq: Once | INTRAMUSCULAR | Status: AC
Start: 2017-05-01 — End: 2017-05-01
  Administered 2017-05-01: 25 ug via INTRAVENOUS

## 2017-05-01 MED ORDER — PHENYLEPHRINE HCL 2.5 % OP SOLN
1.0000 [drp] | OPHTHALMIC | Status: AC
  Administered 2017-05-01 (×3): 1 [drp] via OPHTHALMIC

## 2017-05-01 MED ORDER — MIDAZOLAM HCL 2 MG/2ML IJ SOLN
1.0000 mg | INTRAMUSCULAR | Status: AC
  Administered 2017-05-01: 2 mg via INTRAVENOUS

## 2017-05-01 MED ORDER — BSS IO SOLN
INTRAOCULAR | Status: DC | PRN
  Administered 2017-05-01: 500 mL

## 2017-05-01 MED ORDER — LIDOCAINE HCL (PF) 1 % IJ SOLN
INTRAMUSCULAR | Status: DC | PRN
  Administered 2017-05-01: .7 mL

## 2017-05-01 MED ORDER — NEOMYCIN-POLYMYXIN-DEXAMETH 3.5-10000-0.1 OP SUSP
OPHTHALMIC | Status: DC | PRN
Start: 2017-05-01 — End: 2017-05-01
  Administered 2017-05-01: 2 [drp] via OPHTHALMIC

## 2017-05-01 MED ORDER — TETRACAINE HCL 0.5 % OP SOLN
1.0000 [drp] | OPHTHALMIC | Status: AC
  Administered 2017-05-01 (×3): 1 [drp] via OPHTHALMIC

## 2017-05-01 MED ORDER — CYCLOPENTOLATE-PHENYLEPHRINE 0.2-1 % OP SOLN
1.0000 [drp] | OPHTHALMIC | Status: AC
  Administered 2017-05-01 (×3): 1 [drp] via OPHTHALMIC

## 2017-05-01 MED ORDER — LACTATED RINGERS IV SOLN
INTRAVENOUS | Status: DC
  Administered 2017-05-01: 09:00:00 via INTRAVENOUS

## 2017-05-01 MED ORDER — POVIDONE-IODINE 5 % OP SOLN
OPHTHALMIC | Status: DC | PRN
  Administered 2017-05-01: 1 via OPHTHALMIC

## 2017-05-01 MED ORDER — BSS IO SOLN
INTRAOCULAR | Status: DC | PRN
  Administered 2017-05-01: 15 mL

## 2017-05-01 MED ORDER — PROVISC 10 MG/ML IO SOLN
INTRAOCULAR | Status: DC | PRN
  Administered 2017-05-01: 0.85 mL via INTRAOCULAR

## 2017-05-01 MED ORDER — MIDAZOLAM HCL 2 MG/2ML IJ SOLN
INTRAMUSCULAR | Status: AC
  Filled 2017-05-01: qty 2

## 2017-05-01 MED ORDER — LIDOCAINE HCL 3.5 % OP GEL
1.0000 "application " | Freq: Once | OPHTHALMIC | Status: AC
  Administered 2017-05-01: 1 via OPHTHALMIC

## 2017-05-01 MED ORDER — FENTANYL CITRATE (PF) 100 MCG/2ML IJ SOLN
INTRAMUSCULAR | Status: AC
  Filled 2017-05-01: qty 2

## 2017-05-01 SURGICAL SUPPLY — 10 items

## 2017-05-01 NOTE — Transfer of Care (Signed)
Immediate Anesthesia Transfer of Care Note  Patient: Ryan King  Procedure(s) Performed: CATARACT EXTRACTION PHACO AND INTRAOCULAR LENS PLACEMENT ([place]) (Left Eye)  Patient Location: Short Stay  Anesthesia Type:MAC  Level of Consciousness: awake  Airway & Oxygen Therapy: Patient Spontanous Breathing  Post-op Assessment: Report given to RN and Post -op Vital signs reviewed and stable  Post vital signs: Reviewed and stable  Last Vitals:  Vitals:   05/01/17 0930 05/01/17 0935  BP: 126/67 130/75  Resp: 15 13  SpO2: 100% 100%    Last Pain:  Vitals:   05/01/17 0808  TempSrc: Oral         Complications: No apparent anesthesia complications

## 2017-05-01 NOTE — Consult Note (Signed)
I have reviewed the H&P, the patient was re-examined, and I have identified no interval changes in medical condition and plan of care since the history and physical of record  

## 2017-05-01 NOTE — Op Note (Signed)
Date of Admission: 05/01/2017  Date of Surgery: 05/01/2017  Pre-Op Dx: Cataract Left  Eye  Post-Op Dx: Senile Combined Cataract  Left  Eye,  Dx Code G88.110  Surgeon: Tonny Branch, M.D.  Assistants: None  Anesthesia: Topical with MAC  Indications: Painless, progressive loss of vision with compromise of daily activities.  Surgery: Cataract Extraction with Intraocular lens Implant Left Eye  Discription: The patient had dilating drops and viscous lidocaine placed into the Left eye in the pre-op holding area. After transfer to the operating room, a time out was performed. The patient was then prepped and draped. Beginning with a 11m blade a paracentesis port was made at the surgeon's 2 o'clock position. The anterior chamber was then filled with 1% non-preserved lidocaine. This was followed by filling the anterior chamber with Provisc.  A 2.453mkeratome blade was used to make a clear corneal incision at the temporal limbus.  A bent cystatome needle was used to create a continuous tear capsulotomy. Hydrodissection was performed with balanced salt solution on a Fine canula. The lens nucleus was then removed using the phacoemulsification handpiece. Residual cortex was removed with the I&A handpiece. The anterior chamber and capsular bag were refilled with Provisc. A posterior chamber intraocular lens was placed into the capsular bag with it's injector. The implant was positioned with the Kuglan hook. The Provisc was then removed from the anterior chamber and capsular bag with the I&A handpiece. Stromal hydration of the main incision and paracentesis port was performed with BSS on a Fine canula. The wounds were tested for leak which was negative. The patient tolerated the procedure well. There were no operative complications. The patient was then transferred to the recovery room in stable condition.  Complications: None  Specimen: None  EBL: None  Prosthetic device: Alcon [id number], power 19.5, SN  [id number]

## 2017-05-01 NOTE — Discharge Instructions (Signed)

## 2017-05-01 NOTE — Anesthesia Preprocedure Evaluation (Addendum)
Anesthesia Evaluation  Patient identified by MRN, date of birth, ID band Patient awake    Reviewed: Allergy & Precautions, NPO status , Patient's Chart, lab work & pertinent test results  History of Anesthesia Complications Negative for: history of anesthetic complications  Airway Mallampati: I  TM Distance: >3 FB Neck ROM: Full    Dental  (+) Edentulous Upper, Edentulous Lower, Dental Advisory Given   Pulmonary Current Smoker,    Pulmonary exam normal        Cardiovascular hypertension, Pt. on medications Normal cardiovascular exam     Neuro/Psych negative psych ROS   GI/Hepatic negative GI ROS, Neg liver ROS,   Endo/Other  diabetes, Type 2, Oral Hypoglycemic Agents  Renal/GU      Musculoskeletal   Abdominal   Peds  Hematology   Anesthesia Other Findings   Reproductive/Obstetrics                             Anesthesia Physical Anesthesia Plan  ASA: III  Anesthesia Plan: MAC   Post-op Pain Management:    Induction: Intravenous  PONV Risk Score and Plan:   Airway Management Planned: Nasal Cannula  Additional Equipment:   Intra-op Plan:   Post-operative Plan:   Informed Consent: I have reviewed the patients History and Physical, chart, labs and discussed the procedure including the risks, benefits and alternatives for the proposed anesthesia with the patient or authorized representative who has indicated his/her understanding and acceptance.     Plan Discussed with:   Anesthesia Plan Comments:        Anesthesia Quick Evaluation

## 2017-05-01 NOTE — Anesthesia Postprocedure Evaluation (Signed)
Anesthesia Post Note  Patient: Ryan King  Procedure(s) Performed: CATARACT EXTRACTION PHACO AND INTRAOCULAR LENS PLACEMENT ([place]) (Left Eye)  Patient location during evaluation: Short Stay Anesthesia Type: MAC Level of consciousness: awake and alert and patient cooperative Pain management: pain level controlled Vital Signs Assessment: post-procedure vital signs reviewed and stable Respiratory status: spontaneous breathing, nonlabored ventilation and respiratory function stable Cardiovascular status: blood pressure returned to baseline Postop Assessment: no apparent nausea or vomiting Anesthetic complications: no     Last Vitals:  Vitals:   05/01/17 0930 05/01/17 0935  BP: 126/67 130/75  Resp: 15 13  SpO2: 100% 100%    Last Pain:  Vitals:   05/01/17 0808  TempSrc: Oral                 Wannetta Langland J

## 2017-05-02 ENCOUNTER — Encounter (HOSPITAL_COMMUNITY): Payer: Self-pay | Admitting: Ophthalmology

## 2018-02-01 ENCOUNTER — Encounter: Payer: Self-pay | Admitting: Nurse Practitioner

## 2018-02-02 ENCOUNTER — Encounter: Payer: Self-pay | Admitting: Nurse Practitioner

## 2018-02-09 ENCOUNTER — Encounter: Payer: Self-pay | Admitting: Internal Medicine

## 2018-04-22 NOTE — Progress Notes (Deleted)
Primary Care Physician:  Curlene Labrum, MD Primary Gastroenterologist:  Dr.   Rayne Du chief complaint on file.   HPI:   Ryan King is a 62 y.o. male who presents on referral from primary care for positive hepatitis C.  Reviewed information provided with the referral including office visit dated 02/01/2018.  It appears he has a history of alcohol abuse and cocaine abuse.  Labs were completed 01/06/2018.  Hepatitis A IgM was negative, hepatitis B surface antigen negative, hepatitis B core antibody IgM negative, hepatitis C antibody positive.  Platelets normal at 242.  HIV screening was negative.  AST/ALT appeared normal at 20/20, bilirubin normal at 0.4, alkaline phosphatase normal at 66.  Today he states   Past Medical History:  Diagnosis Date  . Arthritis   . Essential hypertension   . Irregular heart rate    saw Dr. Rozann Lesches 10/2016; PACs, brief PAT on Holter  . Type 2 diabetes mellitus with diabetic neuropathy ([place])    type 2    Past Surgical History:  Procedure Laterality Date  . ANTERIOR CERVICAL DECOMP/DISCECTOMY FUSION N/A 01/20/2017   Procedure: Anterior Cervical Discectomy Fusion - Cervical three-Cervical four - Cervical four-Cervical five, possible Cervical four Corpectomy;  Surgeon: Consuella Lose, MD;  Location: [facility];  Service: Neurosurgery;  Laterality: N/A;  . Arm Surgery Right 06/2016  . CATARACT EXTRACTION W/PHACO Left 05/01/2017   Procedure: CATARACT EXTRACTION PHACO AND INTRAOCULAR LENS PLACEMENT ([facility]);  Surgeon: Tonny Branch, MD;  [facility];  Service: Ophthalmology;  Laterality: Left;  CDE: 8.47  . COLONOSCOPY N/A 10/18/2016   Procedure: COLONOSCOPY;  Surgeon: Daneil Dolin, MD;  [facility];  Service: Endoscopy;  Laterality: N/A;  10:00 AM  . EYE SURGERY     right eye cataract  . FRACTURE SURGERY Right 06/2016    Current Outpatient Medications  Medication Sig Dispense Refill  . glipiZIDE (GLUCOTROL XL) 5 MG 24 hr tablet Take 5  mg by mouth 2 (two) times daily before lunch and supper.     . metFORMIN (GLUCOPHAGE) 1000 MG tablet Take 1,000 mg by mouth 2 (two) times daily with a meal.    . pregabalin (LYRICA) 200 MG capsule Take 200 mg by mouth 2 (two) times daily.    . Vitamin D, Ergocalciferol, (DRISDOL) 50000 units CAPS capsule Take 50,000 Units by mouth every 7 (seven) days. Saturday     No current facility-administered medications for this visit.     Allergies as of 04/23/2018  . (No Known Allergies)    Family History  Problem Relation Age of Onset  . Diabetes Mother   . Hypertension Mother   . Lung cancer Brother   . Multiple sclerosis Brother   . Stroke Brother   . Diabetes Mellitus II Brother     Social History   Socioeconomic History  . Marital status: Single    Spouse name: Not on file  . Number of children: Not on file  . Years of education: Not on file  . Highest education level: Not on file  Occupational History  . Not on file  Social Needs  . Financial resource strain: Not on file  . Food insecurity:    Worry: Not on file    Inability: Not on file  . Transportation needs:    Medical: Not on file    Non-medical: Not on file  Tobacco Use  . Smoking status: Current Every Day Smoker    Packs/day: 1.00  Years: 45.00    Pack years: 45.00    Types: Cigarettes  . Smokeless tobacco: Never Used  Substance and Sexual Activity  . Alcohol use: Yes    Comment: Rarely-Once month  . Drug use: No  . Sexual activity: Not on file  Lifestyle  . Physical activity:    Days per week: Not on file    Minutes per session: Not on file  . Stress: Not on file  Relationships  . Social connections:    Talks on phone: Not on file    Gets together: Not on file    Attends religious service: Not on file    Active member of club or organization: Not on file    Attends meetings of clubs or organizations: Not on file    Relationship status: Not on file  . Intimate partner violence:    Fear of  current or ex partner: Not on file    Emotionally abused: Not on file    Physically abused: Not on file    Forced sexual activity: Not on file  Other Topics Concern  . Not on file  Social History Narrative  . Not on file    Review of Systems: General: Negative for anorexia, weight loss, fever, chills, fatigue, weakness. Eyes: Negative for vision changes.  ENT: Negative for hoarseness, difficulty swallowing , nasal congestion. CV: Negative for chest pain, angina, palpitations, dyspnea on exertion, peripheral edema.  Respiratory: Negative for dyspnea at rest, dyspnea on exertion, cough, sputum, wheezing.  GI: See history of present illness. GU:  Negative for dysuria, hematuria, urinary incontinence, urinary frequency, nocturnal urination.  MS: Negative for joint pain, low back pain.  Derm: Negative for rash or itching.  Neuro: Negative for weakness, abnormal sensation, seizure, frequent headaches, memory loss, confusion.  Psych: Negative for anxiety, depression, suicidal ideation, hallucinations.  Endo: Negative for unusual weight change.  Heme: Negative for bruising or bleeding. Allergy: Negative for rash or hives.    Physical Exam: There were no vitals taken for this visit. General:   Alert and oriented. Pleasant and cooperative. Well-nourished and well-developed.  Head:  Normocephalic and atraumatic. Eyes:  Without icterus, sclera clear and conjunctiva pink.  Ears:  Normal auditory acuity. Mouth:  No deformity or lesions, oral mucosa pink.  Throat/Neck:  Supple, without mass or thyromegaly. Cardiovascular:  S1, S2 present without murmurs appreciated. Normal pulses noted. Extremities without clubbing or edema. Respiratory:  Clear to auscultation bilaterally. No wheezes, rales, or rhonchi. No distress.  Gastrointestinal:  +BS, soft, non-tender and non-distended. No HSM noted. No guarding or rebound. No masses appreciated.  Rectal:  Deferred  Musculoskalatal:  Symmetrical  without gross deformities. Normal posture. Skin:  Intact without significant lesions or rashes. Neurologic:  Alert and oriented x4;  grossly normal neurologically. Psych:  Alert and cooperative. Normal mood and affect. Heme/Lymph/Immune: No significant cervical adenopathy. No excessive bruising noted.    04/22/2018 7:06 PM   Disclaimer: This note was dictated with voice recognition software. Similar sounding words can inadvertently be transcribed and may not be corrected upon review.

## 2018-04-23 ENCOUNTER — Ambulatory Visit: Payer: Medicaid Other | Admitting: Nurse Practitioner

## 2018-07-09 DIAGNOSIS — E1140 Type 2 diabetes mellitus with diabetic neuropathy, unspecified: Secondary | ICD-10-CM | POA: Diagnosis not present

## 2018-07-09 DIAGNOSIS — E1165 Type 2 diabetes mellitus with hyperglycemia: Secondary | ICD-10-CM | POA: Diagnosis not present

## 2018-07-09 DIAGNOSIS — E1122 Type 2 diabetes mellitus with diabetic chronic kidney disease: Secondary | ICD-10-CM | POA: Diagnosis not present

## 2018-07-13 DIAGNOSIS — Z0001 Encounter for general adult medical examination with abnormal findings: Secondary | ICD-10-CM | POA: Diagnosis not present

## 2018-07-13 DIAGNOSIS — E1140 Type 2 diabetes mellitus with diabetic neuropathy, unspecified: Secondary | ICD-10-CM | POA: Diagnosis not present

## 2018-07-13 DIAGNOSIS — D126 Benign neoplasm of colon, unspecified: Secondary | ICD-10-CM | POA: Diagnosis not present

## 2018-07-24 ENCOUNTER — Ambulatory Visit: Payer: Medicaid Other | Admitting: Nurse Practitioner

## 2018-07-24 ENCOUNTER — Encounter

## 2018-10-03 ENCOUNTER — Ambulatory Visit (INDEPENDENT_AMBULATORY_CARE_PROVIDER_SITE_OTHER): Payer: Medicare Other | Admitting: Nurse Practitioner

## 2018-10-03 ENCOUNTER — Encounter: Payer: Self-pay | Admitting: Nurse Practitioner

## 2018-10-03 ENCOUNTER — Other Ambulatory Visit: Payer: Self-pay

## 2018-10-03 ENCOUNTER — Encounter: Payer: Self-pay | Admitting: Internal Medicine

## 2018-10-03 ENCOUNTER — Telehealth: Payer: Self-pay

## 2018-10-03 DIAGNOSIS — B182 Chronic viral hepatitis C: Secondary | ICD-10-CM | POA: Diagnosis not present
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
## 2018-10-03 NOTE — Patient Instructions (Signed)
Your health issues we discussed today were:   Hepatitis C: 1. We will need to check several labs prior to requesting treatment for your insurance company 2. We will need to do an abdominal ultrasound as well as a "special test of your liver" to check for scarring 3. Further recommendations including possible treatment options to follow your work-up and labs  Overall I recommend:  1. Return for follow-up in 3 months 2. Call us if you have any questions or concerns.   Because of recent events of COVID-19 ("Coronavirus"), follow CDC recommendations:  1. Wash your hand frequently 2. Avoid touching your face 3. Stay away from people who are sick 4. If you have symptoms such as fever, cough, shortness of breath then call your healthcare provider for further guidance 5. If you are sick, STAY AT HOME unless otherwise directed by your healthcare provider. 6. Follow directions from state and national officials regarding staying safe   At [facility] we value your feedback. You may receive a survey about your visit today. Please share your experience as we strive to create trusting relationships with our patients to provide genuine, compassionate, quality care.  We appreciate your understanding and patience as we review any laboratory studies, imaging, and other diagnostic tests that are ordered as we care for you. Our office policy is 5 business days for review of these results, and any emergent or urgent results are addressed in a timely manner for your best interest. If you do not hear from our office in 1 week, please contact us.   We also encourage the use of MyChart, which contains your medical information for your review as well. If you are not enrolled in this feature, an access code is on this after visit summary for your convenience. Thank you for allowing [place] to be involved in your care.  It was great to see you today!  I hope you have a great day!!

## 2018-10-03 NOTE — Progress Notes (Signed)
Referring Provider: Curlene Labrum, MD Primary Care Physician:  Curlene Labrum, MD Primary GI:  Dr. Gala Romney  NOTE: Service was provided via telemedicine and was requested by the patient due to COVID-19 pandemic.  Method of visit: Zoom  Patient Location: Home  Provider Location: Office  Reason for Phone Visit: Referral from PCP  The patient was consented to phone follow-up via telephone encounter including billing of the encounter (yes/no): Yes  Persons present on the phone encounter, with roles: Daughter  Total time (minutes) spent on medical discussion: 25 minutes  Chief Complaint  Patient presents with  . Hepatitis C    HPI:   Ryan King is a 63 y.o. male who presents for virtual visit regarding: On referral from primary care due to hepatitis C positive.  Reviewed information provided with referral including office visit dated 02/01/2018.  At that time the patient had some "bad watermelon" and took Pepto-Bismol which turned his stools black.  Previous history of positive hepatitis C antibody in July 2019 and some difficulties obtaining hep C RNA lab.  Reviewed labs including HCV RNA dated 02/02/2018 which found approximately 2 million copies.  It appears an HIV test was ordered but not completed.  CMP dated 01/05/2018 found essentially normal.  CBC the same day also essentially normal with a platelet count of 242.  Colonoscopy up-to-date 10/18/2016 and next due in 3 years (2021).  Today she states he's doing ok overall. Denies abdominal pain, N/V, hematochezia, melena, fever, chills, unintentional weight loss. Denies yellowing of skin/eyes, darkened urine, acute episodic confusion, tremors. Has chronic cough related to smoking, at baseline. No other URI or flu-like symptoms. Denies chest pain, dyspnea, dizziness, lightheadedness, syncope, near syncope. Denies any other upper or lower GI symptoms.  Occasional/rare crack cocaine use.  Hepatitis C Risk Factors:  Birth  cohort ([place]): Yes IV drug use: No Tattoos: No Blood product transfusion: No HC worker: No Hemodialysis: No Maternal infection: No    Past Medical History:  Diagnosis Date  . Arthritis   . Essential hypertension   . Irregular heart rate    saw Dr. Rozann Lesches 10/2016; PACs, brief PAT on Holter  . Type 2 diabetes mellitus with diabetic neuropathy ([place])    type 2    Past Surgical History:  Procedure Laterality Date  . ANTERIOR CERVICAL DECOMP/DISCECTOMY FUSION N/A 01/20/2017   Procedure: Anterior Cervical Discectomy Fusion - Cervical three-Cervical four - Cervical four-Cervical five, possible Cervical four Corpectomy;  Surgeon: Consuella Lose, MD;  Location: [place];  Service: Neurosurgery;  Laterality: N/A;  . Arm Surgery Right 06/2016  . CATARACT EXTRACTION W/PHACO Left 05/01/2017   Procedure: CATARACT EXTRACTION PHACO AND INTRAOCULAR LENS PLACEMENT ([facility]);  Surgeon: Tonny Branch, MD;  [facility];  Service: Ophthalmology;  Laterality: Left;  CDE: 8.47  . COLONOSCOPY N/A 10/18/2016   Procedure: COLONOSCOPY;  Surgeon: Daneil Dolin, MD;  [facility];  Service: Endoscopy;  Laterality: N/A;  10:00 AM  . EYE SURGERY     right eye cataract  . FRACTURE SURGERY Right 06/2016    Current Outpatient Medications  Medication Sig Dispense Refill  . atorvastatin (LIPITOR) 20 MG tablet Twice a week    . glipiZIDE (GLUCOTROL XL) 5 MG 24 hr tablet Take 5 mg by mouth 2 (two) times daily before lunch and supper.     Marland Kitchen lisinopril (PRINIVIL,ZESTRIL) 5 MG tablet Take 1 tablet by mouth daily.    . metFORMIN (GLUCOPHAGE) 1000 MG  tablet Take 1,000 mg by mouth 2 (two) times daily with a meal.     No current facility-administered medications for this visit.     Allergies as of 10/03/2018  . (No Known Allergies)    Family History  Problem Relation Age of Onset  . Diabetes Mother   . Hypertension Mother   . Lung cancer Brother   . Colon cancer Brother        he thinks  so, but not sure  . Multiple sclerosis Brother   . Stroke Brother   . Diabetes Mellitus II Brother     Social History   Socioeconomic History  . Marital status: Single    Spouse name: Not on file  . Number of children: Not on file  . Years of education: Not on file  . Highest education level: Not on file  Occupational History  . Not on file  Social Needs  . Financial resource strain: Not on file  . Food insecurity:    Worry: Not on file    Inability: Not on file  . Transportation needs:    Medical: Not on file    Non-medical: Not on file  Tobacco Use  . Smoking status: Current Every Day Smoker    Packs/day: 1.00    Years: 45.00    Pack years: 45.00    Types: Cigarettes  . Smokeless tobacco: Never Used  Substance and Sexual Activity  . Alcohol use: Yes    Comment: Rarely-Once month  . Drug use: Yes    Types: "Crack" cocaine    Comment: "just a little"  . Sexual activity: Not on file  Lifestyle  . Physical activity:    Days per week: Not on file    Minutes per session: Not on file  . Stress: Not on file  Relationships  . Social connections:    Talks on phone: Not on file    Gets together: Not on file    Attends religious service: Not on file    Active member of club or organization: Not on file    Attends meetings of clubs or organizations: Not on file    Relationship status: Not on file  Other Topics Concern  . Not on file  Social History Narrative  . Not on file    Review of Systems: General: Negative for anorexia, weight loss, fever, chills, fatigue, weakness. ENT: Negative for hoarseness, difficulty swallowing. CV: Negative for chest pain, angina, palpitations, peripheral edema.  Respiratory: Negative for dyspnea at rest, cough, sputum, wheezing.  GI: See history of present illness. Endo: Negative for unusual weight change.   Physical Exam: Note: limited exam due to virtual visit General:   Alert and oriented. Pleasant and cooperative.  Well-nourished and well-developed.  Head:  Normocephalic and atraumatic. Eyes:  Without icterus, sclera clear and conjunctiva pink.  Ears:  Normal auditory acuity. Skin:  Intact without facial significant lesions or rashes. Neurologic:  Alert and oriented x4;  grossly normal neurologically. Psych:  Alert and cooperative. Normal mood and affect. Heme/Lymph/Immune: No excessive bruising noted.

## 2018-10-03 NOTE — Telephone Encounter (Signed)
[place] abd complete w/liver elastography scheduled for 11/26/18 at 8:30am, arrive at 8:15am. NPO after midnight before test. Tried to call pt at mobile number listed, male answered and said to call pt at [phone]. Tried to call pt x 2, phone rings once then stops. Appt letter mailed.  Attempted to submit PA for [place] via [employer]. No PA needed: NOTE: This [facility] member does not require prior authorization for OUTPATIENT Radiology through [place] or [place] [facility] at this time.

## 2018-10-04 NOTE — Progress Notes (Signed)
CC'D TO PCP °

## 2018-10-29 ENCOUNTER — Encounter: Payer: Self-pay | Admitting: Nurse Practitioner

## 2018-10-29 DIAGNOSIS — E1140 Type 2 diabetes mellitus with diabetic neuropathy, unspecified: Secondary | ICD-10-CM | POA: Diagnosis not present

## 2018-10-29 DIAGNOSIS — M5030 Other cervical disc degeneration, unspecified cervical region: Secondary | ICD-10-CM | POA: Diagnosis not present

## 2018-10-29 DIAGNOSIS — D126 Benign neoplasm of colon, unspecified: Secondary | ICD-10-CM | POA: Diagnosis not present

## 2018-11-26 ENCOUNTER — Ambulatory Visit (HOSPITAL_COMMUNITY): Admission: RE | Admit: 2018-11-26 | Payer: Medicare Other | Source: Ambulatory Visit

## 2018-12-10 ENCOUNTER — Other Ambulatory Visit: Payer: Self-pay

## 2018-12-10 NOTE — Patient Outreach (Signed)
[facility]) Care Management  12/10/2018  Ryan King Oct 03, 1955 [id number]   Medication Adherence call to Ryan King HIPPA Compliant Voice message left with a call back number. Ryan King is showing past due on Lisinopril 5 mg,Atorvasttain 20 mg,Glipizide Er 5 mg Metformin 1000 mg under Ryan King.   [facility] Direct Dial [phone]  Fax [phone] [email or web address]

## 2019-01-03 ENCOUNTER — Ambulatory Visit (INDEPENDENT_AMBULATORY_CARE_PROVIDER_SITE_OTHER): Payer: Medicare Other | Admitting: Nurse Practitioner

## 2019-01-03 ENCOUNTER — Other Ambulatory Visit: Payer: Self-pay

## 2019-01-03 ENCOUNTER — Encounter: Payer: Self-pay | Admitting: *Deleted

## 2019-01-03 ENCOUNTER — Encounter: Payer: Self-pay | Admitting: Nurse Practitioner

## 2019-01-03 ENCOUNTER — Other Ambulatory Visit: Payer: Self-pay | Admitting: *Deleted

## 2019-01-03 VITALS — BP 132/72 | HR 67 | Temp 97.4°F | Ht 71.0 in | Wt 151.0 lb

## 2019-01-03 DIAGNOSIS — B182 Chronic viral hepatitis C: Secondary | ICD-10-CM

## 2019-01-03 NOTE — Progress Notes (Signed)
Referring Provider: Curlene Labrum, MD Primary Care Physician:  Curlene Labrum, MD Primary GI:  Dr. Gala Romney  Chief Complaint  Patient presents with  . Hepatitis C    HPI:   Ryan King is a 63 y.o. male who presents for follow-up on chronic hepatitis C.  The patient was last seen in our office 10/03/2018 for the same.  His last visit was a virtual encounter due to COVID-19/coronavirus pandemic.  Previously tested positive in July 2019 but there are difficulties obtaining hepatitis C RNA lab.  A recheck on labs found RNA dated 02/02/2018 with approximately 2 million copies.  HIV was ordered but not completed.  CMP 01/05/2018 essentially normal.  CBC also essentially normal with specifically a platelet count of 242.  Colonoscopy up-to-date next due in 2021.  At his last visit he denied any overt GI or hepatic symptoms.  He did admit occasional/rare crack cocaine use.  Review of risk factors shows likely transmission by birth cohort or possible intranasal drugs less likely.  Recommended check labs and ultrasound elastography for further evaluation.  Follow-up in 3 months.  Labs ordered for 15 2020 including CBC, CMP, hepatitis B serologies, HIV, hepatitis C RNA.  No labs were completed.  Ultrasound elastography was also ordered but not completed.  Today he states he's doing ok overall. States he can't remember getting lab slips, though he did get his AVS in the mail. He had labs with Dr. Lizbeth Bark office, but not sure what was done. He didn't get his ultrasound. States he doesn't think he got it. Verified his address is correct and the letter for his U/S appointment was generated/sent 10/03/18. Denies abdominal pain, N/V, hematochezia, melena, fever, chills, unintentional weight loss. Does have intermittent diarrhea on Metformin. Denies URI or flu-like symptoms. Denies loss of sense of taste or smell. Denies chest pain, dyspnea, dizziness, lightheadedness, syncope, near syncope. Denies any other  upper or lower GI symptoms.  Past Medical History:  Diagnosis Date  . Arthritis   . Essential hypertension   . Irregular heart rate    saw Dr. Rozann Lesches 10/2016; PACs, brief PAT on Holter  . Type 2 diabetes mellitus with diabetic neuropathy ([place])    type 2    Past Surgical History:  Procedure Laterality Date  . ANTERIOR CERVICAL DECOMP/DISCECTOMY FUSION N/A 01/20/2017   Procedure: Anterior Cervical Discectomy Fusion - Cervical three-Cervical four - Cervical four-Cervical five, possible Cervical four Corpectomy;  Surgeon: Consuella Lose, MD;  Location: [place];  Service: Neurosurgery;  Laterality: N/A;  . Arm Surgery Right 06/2016  . CATARACT EXTRACTION W/PHACO Left 05/01/2017   Procedure: CATARACT EXTRACTION PHACO AND INTRAOCULAR LENS PLACEMENT (IOC);  Surgeon: Tonny Branch, MD;  [facility];  Service: Ophthalmology;  Laterality: Left;  CDE: 8.47  . COLONOSCOPY N/A 10/18/2016   Procedure: COLONOSCOPY;  Surgeon: Daneil Dolin, MD;  [facility];  Service: Endoscopy;  Laterality: N/A;  10:00 AM  . EYE SURGERY     right eye cataract  . FRACTURE SURGERY Right 06/2016    Current Outpatient Medications  Medication Sig Dispense Refill  . atorvastatin (LIPITOR) 20 MG tablet Twice a week    . glipiZIDE (GLUCOTROL XL) 5 MG 24 hr tablet Take 5 mg by mouth 2 (two) times daily before lunch and supper.     Marland Kitchen lisinopril (PRINIVIL,ZESTRIL) 5 MG tablet Take 1 tablet by mouth daily.    . metFORMIN (GLUCOPHAGE) 1000 MG tablet Take 1,000 mg by mouth  2 (two) times daily with a meal.     No current facility-administered medications for this visit.     Allergies as of 01/03/2019  . (No Known Allergies)    Family History  Problem Relation Age of Onset  . Diabetes Mother   . Hypertension Mother   . Lung cancer Brother   . Colon cancer Brother        he thinks so, but not sure  . Multiple sclerosis Brother   . Stroke Brother   . Diabetes Mellitus II Brother     Social  History   Socioeconomic History  . Marital status: Single    Spouse name: Not on file  . Number of children: Not on file  . Years of education: Not on file  . Highest education level: Not on file  Occupational History  . Not on file  Social Needs  . Financial resource strain: Not on file  . Food insecurity    Worry: Not on file    Inability: Not on file  . Transportation needs    Medical: Not on file    Non-medical: Not on file  Tobacco Use  . Smoking status: Current Every Day Smoker    Packs/day: 1.00    Years: 45.00    Pack years: 45.00    Types: Cigarettes  . Smokeless tobacco: Never Used  Substance and Sexual Activity  . Alcohol use: Yes    Comment: Rarely-Once month  . Drug use: Yes    Types: "Crack" cocaine    Comment: As of 01/03/19 "not that much" "maybe months apart" last use a few days ago  . Sexual activity: Not on file  Lifestyle  . Physical activity    Days per week: Not on file    Minutes per session: Not on file  . Stress: Not on file  Relationships  . Social Herbalist on phone: Not on file    Gets together: Not on file    Attends religious service: Not on file    Active member of club or organization: Not on file    Attends meetings of clubs or organizations: Not on file    Relationship status: Not on file  Other Topics Concern  . Not on file  Social History Narrative  . Not on file    Review of Systems: General: Negative for anorexia, weight loss, fever, chills, fatigue, weakness. ENT: Negative for hoarseness, difficulty swallowing. CV: Negative for chest pain, angina, palpitations, peripheral edema.  Respiratory: Negative for dyspnea at rest, cough, sputum, wheezing.  GI: See history of present illness. Endo: Negative for unusual weight change.  Heme: Negative for bruising or bleeding. Allergy: Negative for rash or hives.   Physical Exam: BP 132/72   Pulse 67   Temp (!) 97.4 F (36.3 C) (Oral)   Ht 5\' 11"  (1.803 m)   Wt  151 lb (68.5 kg)   BMI 21.06 kg/m  General:   Alert and oriented. Pleasant and cooperative. Well-nourished and well-developed.  Eyes:  Without icterus, sclera clear and conjunctiva pink.  Ears:  Normal auditory acuity. Cardiovascular:  S1, S2 present without murmurs appreciated. Extremities without clubbing or edema. Respiratory:  Clear to auscultation bilaterally. No wheezes, rales, or rhonchi. No distress.  Gastrointestinal:  +BS, soft, non-tender and non-distended. No HSM noted. No guarding or rebound. No masses appreciated.  Rectal:  Deferred  Musculoskalatal:  Symmetrical without gross deformities. Neurologic:  Alert and oriented x4;  grossly normal neurologically. Psych:  Alert and cooperative. Normal mood and affect. Heme/Lymph/Immune: No excessive bruising noted.    01/03/2019 9:50 AM   Disclaimer: This note was dictated with voice recognition software. Similar sounding words can inadvertently be transcribed and may not be corrected upon review.

## 2019-01-03 NOTE — Patient Instructions (Signed)
Your health issues we discussed today were:   Hepatitis C: 1. It is very important to have your labs done under ultrasound completed 2. We will reprint the paperwork and give this to you 3. Someone from radiology will call to schedule your ultrasound appointment  Overall I recommend:  1. Continue your other current medications 2. You should abstain from using drugs as it can make it difficult to take your medication regularly 3. Follow-up in 2 months 4. Call us if you have any questions or concerns.   Because of recent events of COVID-19 ("Coronavirus"), follow CDC recommendations:  1. Wash your hand frequently 2. Avoid touching your face 3. Stay away from people who are sick 4. If you have symptoms such as fever, cough, shortness of breath then call your healthcare provider for further guidance 5. If you are sick, STAY AT HOME unless otherwise directed by your healthcare provider. 6. Follow directions from state and national officials regarding staying safe   At [facility] we value your feedback. You may receive a survey about your visit today. Please share your experience as we strive to create trusting relationships with our patients to provide genuine, compassionate, quality care.  We appreciate your understanding and patience as we review any laboratory studies, imaging, and other diagnostic tests that are ordered as we care for you. Our office policy is 5 business days for review of these results, and any emergent or urgent results are addressed in a timely manner for your best interest. If you do not hear from our office in 1 week, please contact us.   We also encourage the use of MyChart, which contains your medical information for your review as well. If you are not enrolled in this feature, an access code is on this after visit summary for your convenience. Thank you for allowing [place] to be involved in your care.  It was great to see you today!  I hope you have  a great summer!!

## 2019-01-03 NOTE — Assessment & Plan Note (Signed)
The patient was previously seen virtually for hepatitis C.  Needed updated labs and right upper quadrant ultrasound.  We mailed him his after visit summary, and lab slips, and appointment letter for ultrasound.  He states he got the AVS but did not receive the lab slips for ultrasound appointment letter.  Verified his address in the system which is correct.  He does also currently use crack cocaine.  He states it is few and far between, although he did just use it Monday.  I have significant concerns related to compliance with hepatitis C treatment given his issues with ongoing drug use which could lead to noncompliance as well as not getting labs and imaging as recommended.  I have recommended to reprint his labs and imaging and we will see when he has these completed.  I am not positive he would be a candidate for treatment at this time given his issues.  We will touch base with him again in 2 months to further discuss.

## 2019-01-03 NOTE — Progress Notes (Signed)
Per EG order abd u/s complete with elastography as last time

## 2019-01-04 ENCOUNTER — Other Ambulatory Visit: Payer: Self-pay

## 2019-01-04 NOTE — Patient Outreach (Signed)
[facility]) Care Management  01/04/2019  DAKHARI ZUVER [birth year] [id number]   Medication Adherence call to Mr. Darrow Bussing HIPPA Compliant Voice message left with a call back number. Mr. Betts is showing past due on Atorvastatin 20 mg under [place].   [place] Management Direct Dial [phone]  Fax [phone] [email or web address]

## 2019-01-04 NOTE — Progress Notes (Signed)
cc'd to pcp 

## 2019-01-08 ENCOUNTER — Other Ambulatory Visit: Payer: Self-pay

## 2019-01-08 ENCOUNTER — Ambulatory Visit (HOSPITAL_COMMUNITY)
Admission: RE | Admit: 2019-01-08 | Discharge: 2019-01-08 | Disposition: A | Discharge status: Home / Self Care | Payer: Medicare Other | Source: Ambulatory Visit | Attending: Nurse Practitioner | Admitting: Nurse Practitioner

## 2019-01-08 DIAGNOSIS — B182 Chronic viral hepatitis C: Secondary | ICD-10-CM | POA: Diagnosis present

## 2019-01-08 DIAGNOSIS — I77811 Abdominal aortic ectasia: Secondary | ICD-10-CM | POA: Diagnosis not present

## 2019-01-08 DIAGNOSIS — K824 Cholesterolosis of gallbladder: Secondary | ICD-10-CM | POA: Diagnosis not present

## 2019-01-09 NOTE — Progress Notes (Signed)
Received fax lab results completed 10/30/2018.  Hemoglobin A1c elevated at 8.8.  CMP with hyperglycemia 991, low albumin of 3.4, otherwise normal.  Cholesterol panel essentially normal other than low HDL.  We will mark these into the system for scanning

## 2019-01-23 DIAGNOSIS — E559 Vitamin D deficiency, unspecified: Secondary | ICD-10-CM | POA: Diagnosis not present

## 2019-01-23 DIAGNOSIS — E1122 Type 2 diabetes mellitus with diabetic chronic kidney disease: Secondary | ICD-10-CM | POA: Diagnosis not present

## 2019-01-30 DIAGNOSIS — D126 Benign neoplasm of colon, unspecified: Secondary | ICD-10-CM | POA: Diagnosis not present

## 2019-01-30 DIAGNOSIS — E1140 Type 2 diabetes mellitus with diabetic neuropathy, unspecified: Secondary | ICD-10-CM | POA: Diagnosis not present

## 2019-01-30 DIAGNOSIS — M25511 Pain in right shoulder: Secondary | ICD-10-CM | POA: Diagnosis not present

## 2019-03-06 ENCOUNTER — Ambulatory Visit (INDEPENDENT_AMBULATORY_CARE_PROVIDER_SITE_OTHER): Payer: Medicare Other | Admitting: Nurse Practitioner

## 2019-03-06 ENCOUNTER — Other Ambulatory Visit: Payer: Self-pay

## 2019-03-06 ENCOUNTER — Encounter: Payer: Self-pay | Admitting: Nurse Practitioner

## 2019-03-06 VITALS — BP 184/77 | HR 65 | Temp 96.8°F | Ht 70.5 in | Wt 158.4 lb

## 2019-03-06 DIAGNOSIS — B182 Chronic viral hepatitis C: Secondary | ICD-10-CM

## 2019-03-06 NOTE — Patient Instructions (Addendum)
Your health issues we discussed today were:   Chronic hepatitis C infection: 1. We have received all your labs and imaging.  We will start the prior authorization process with your insurance for hepatitis C treatment 2. Your hepatitis C medication will be shipped to our office.  Per your request we will notify her daughter when it arrives 3. When you pick up your medication you will need to sign an agreement to not start any new medications without checking with [place] first 4. It is very important when you start treatment to take your medication every day, the same time every day, without missing doses 5. If you have any side effects, concerns, questions please call our office 6. You will need to have labs and possibly an office visit 4 weeks after starting treatment, at the end of treatment, and 3 months after finishing treatment 7. It is imperative that you avoid all drugs and alcohol while undergoing treatment  Overall I recommend:  1. Continue your other current medications 2. Call us if you have any questions or concerns 3. Follow-up in 4 to 8 weeks after picking up your medications.   Because of recent events of COVID-19 ("Coronavirus"), follow CDC recommendations:  Wash your hand frequently Avoid touching your face Stay away from people who are sick If you have symptoms such as fever, cough, shortness of breath then call your healthcare provider for further guidance If you are sick, STAY AT HOME unless otherwise directed by your healthcare provider. Follow directions from state and national officials regarding staying safe   At [facility] we value your feedback. You may receive a survey about your visit today. Please share your experience as we strive to create trusting relationships with our patients to provide genuine, compassionate, quality care.  We appreciate your understanding and patience as we review any laboratory studies, imaging, and other diagnostic tests  that are ordered as we care for you. Our office policy is 5 business days for review of these results, and any emergent or urgent results are addressed in a timely manner for your best interest. If you do not hear from our office in 1 week, please contact us.   We also encourage the use of MyChart, which contains your medical information for your review as well. If you are not enrolled in this feature, an access code is on this after visit summary for your convenience. Thank you for allowing [place] to be involved in your care.  It was great to see you today!  I hope you have a great Fall and enjoy fishing!!

## 2019-03-06 NOTE — Assessment & Plan Note (Addendum)
Hepatitis C not resulted to our office back to her primary care.  Findings include essentially normal CMP with normal LFTs and normal creatinine.  CBC normal including platelet count of 252.  HIV nonreactive.  Hepatitis C RNA confirmed at 4,020,000 copies and identifies genotype 1a.  Hepatitis B surface antigen negative, core antibody positive, surface antibody reactive which is indicative of immunity due to natural infection with no active infection.  Again, medical quadrant ultrasound elastography found Matavir F2/some F3.  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  There is potential for some improvement in his liver fibrosis after treatment.  I had a lengthy and in-depth discussion about treatment regimens, need for strict adherence, avoid all drugs and alcohol while treating.  He was informed he would need to notify us of any new medications either prescription, over-the-counter, herbal, supplement, or otherwise prior to taking to ensure no interaction with medication.  At this point we will start a prior off process for hepatitis C treatment with either Harvoni or Epclusa.  We will have this shipped to our office.  Due to previous hepatitis B infection but no active infection he will need labs at 4 weeks after treatment, end of treatment, 12 weeks after treatment with an office visit associated with each of these, if possible.  If he has any elevation in his LFTs will refer him to the liver clinic in [facility] urgently for hepatitis B treatment.  He has requested we call his daughter to notify him when the medication arrives in our office. Hep C treatment checklist as per below.  Hepatitis C Treatment Checklist  HCV RNA: 4.2 Million HCV GT: 1a Elastography: F2/Some F3  Tx history: None Hepatitis B serologies: Immune from natural infection; no active infection HIV Serologies: Negative Contraindicated Medications: None Contraception: N/A

## 2019-03-06 NOTE — Progress Notes (Signed)
Referring Provider: Curlene Labrum, MD Primary Care Physician:  Curlene Labrum, MD Primary GI:  Dr. Gala Romney  Chief Complaint  Patient presents with   Hepatitis C    HPI:   Ryan King is a 63 y.o. male who presents for follow-up on hepatitis C. The patient was last seen in our office 01/03/2019 for chronic Hepatitis C. hepatitis C likely due to to transmission by both cohort or possible intranasal drug use.  No further drug use.  We have asked him difficulties getting a hold of his previous labs.  At a previous office visit more labs were ordered and mailed to him because it was a virtual office visit due to COVID-19/coronavirus pandemic.  He states he did not remember getting these lab slips.  He did not have his ultrasound of his liver completed.  At his last visit he indicated he had labs with his primary care and these were obtained but not useful for this situation.  Confirmed his address is correct.  Intermittent diarrhea noted on metformin.  No other GI complaints.  I emphasized the importance of having his labs and ultrasound completed.  Continue current medications, abstain from drug use, follow-up in 2 months.  At his last visit he noted last crack cocaine use was a few days prior.  Review of labs indicate he still has not had his labs completed that were ordered for 15 2020.  This is despite the fact that we reprinted the lab sheets for him.  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  Elastography found Matavir fibrosis score of F2 and some F3.  Today he states he's doing ok overall. Denies abdominal pain, N/V, hematochezia, melena, fever, chills, unintentional weight loss. Denies yellowing of skin/eyes, darkened urine, acute episodic confusion, tremors, generalized pruritis. Denies URI or  flu-like symptoms. Denies loss of sense of taste or smell. Denies chest pain, dyspnea, dizziness, lightheadedness, syncope, near syncope. Denies any other upper or lower GI symptoms.  Colonoscopy is up to date. Next due in 2021 due to high risk: brother with CRC and 4 polyps in 2018.  **A total of 30 minutes was spent with the patient of which at least 50% was spent on care coordination and patient education.**  Past Medical History:  Diagnosis Date   Arthritis    Essential hypertension    Irregular heart rate    saw Dr. Rozann Lesches 10/2016; PACs, brief PAT on Holter   Type 2 diabetes mellitus with diabetic neuropathy ([place])    type 2    Past Surgical History:  Procedure Laterality Date   ANTERIOR CERVICAL DECOMP/DISCECTOMY FUSION N/A 01/20/2017   Procedure: Anterior Cervical Discectomy Fusion - Cervical three-Cervical four - Cervical four-Cervical five, possible Cervical four Corpectomy;  Surgeon: Consuella Lose, MD;  Location: [place];  Service: Neurosurgery;  Laterality: N/A;   Arm Surgery Right 06/2016   CATARACT EXTRACTION W/PHACO Left 05/01/2017   Procedure: CATARACT EXTRACTION PHACO AND INTRAOCULAR LENS PLACEMENT (IOC);  Surgeon: Tonny Branch, MD;  [facility];  Service: Ophthalmology;  Laterality: Left;  CDE: 8.47   COLONOSCOPY N/A 10/18/2016   Procedure: COLONOSCOPY;  Surgeon: Daneil Dolin, MD;  [facility];  Service: Endoscopy;  Laterality: N/A;  10:00 AM   EYE SURGERY     right eye cataract   FRACTURE SURGERY Right 06/2016  Current Outpatient Medications  Medication Sig Dispense Refill   atorvastatin (LIPITOR) 20 MG tablet Twice a week     glipiZIDE (GLUCOTROL XL) 5 MG 24 hr tablet Take 5 mg by mouth 2 (two) times daily before lunch and supper.      lisinopril (PRINIVIL,ZESTRIL) 5 MG tablet Take 1 tablet by mouth daily.     metFORMIN (GLUCOPHAGE) 1000 MG tablet Take 1,000 mg by mouth 2 (two) times daily with a meal.     Multiple  Vitamin (MULTIVITAMIN) tablet Take 1 tablet by mouth daily.     No current facility-administered medications for this visit.     Allergies as of 03/06/2019   (No Known Allergies)    Family History  Problem Relation Age of Onset   Diabetes Mother    Hypertension Mother    Lung cancer Brother    Colon cancer Brother        he thinks so, but not sure   Multiple sclerosis Brother    Stroke Brother    Diabetes Mellitus II Brother     Social History   Socioeconomic History   Marital status: Single    Spouse name: Not on file   Number of children: Not on file   Years of education: Not on file   Highest education level: Not on file  Occupational History   Not on file  Social Needs   Financial resource strain: Not on file   Food insecurity    Worry: Not on file    Inability: Not on file   Transportation needs    Medical: Not on file    Non-medical: Not on file  Tobacco Use   Smoking status: Current Every Day Smoker    Packs/day: 1.00    Years: 45.00    Pack years: 45.00    Types: Cigarettes   Smokeless tobacco: Never Used  Substance and Sexual Activity   Alcohol use: Yes    Comment: As of 03/06/19: last drink in 12/19/2018; previously: Rarely-Once month   Drug use: Yes    Types: "Crack" cocaine    Comment: As of 03/06/2019: none in 2+ months. Previously: As of 01/03/19 "not that much" "maybe months apart" last use a few days ago   Sexual activity: Not on file  Lifestyle   Physical activity    Days per week: Not on file    Minutes per session: Not on file   Stress: Not on file  Relationships   Social connections    Talks on phone: Not on file    Gets together: Not on file    Attends religious service: Not on file    Active member of club or organization: Not on file    Attends meetings of clubs or organizations: Not on file    Relationship status: Not on file  Other Topics Concern   Not on file  Social History Narrative   Not on file     Review of Systems: General: Negative for anorexia, weight loss, fever, chills, fatigue, weakness. ENT: Negative for hoarseness, difficulty swallowing. CV: Negative for chest pain, angina, palpitations, peripheral edema.  Respiratory: Negative for dyspnea at rest, cough, sputum, wheezing.  GI: See history of present illness. Derm: Negative for rash or itching.  Neuro: Negative for memory loss, confusion.  Endo: Negative for unusual weight change.  Heme: Negative for bruising or bleeding. Allergy: Negative for rash or hives.   Physical Exam: BP (!) 184/77    Pulse 65    Temp (!)  96.8 F (36 C) (Oral)    Ht 5' 10.5" (1.791 m)    Wt 158 lb 6.4 oz (71.8 kg)    BMI 22.41 kg/m  General:   Alert and oriented. Pleasant and cooperative. Well-nourished and well-developed.  Eyes:  Without icterus, sclera clear and conjunctiva pink.  Ears:  Normal auditory acuity. Cardiovascular:  S1, S2 present without murmurs appreciated. Extremities without clubbing or edema. Respiratory:  Clear to auscultation bilaterally. No wheezes, rales, or rhonchi. No distress.  Gastrointestinal:  +BS, soft, non-tender and non-distended. No HSM noted. No guarding or rebound. No masses appreciated.  Rectal:  Deferred  Musculoskalatal:  Symmetrical without gross deformities. Neurologic:  Alert and oriented x4;  grossly normal neurologically. Psych:  Alert and cooperative. Normal mood and affect. Heme/Lymph/Immune: No excessive bruising noted.    03/06/2019 9:39 AM   Disclaimer: This note was dictated with voice recognition software. Similar sounding words can inadvertently be transcribed and may not be corrected upon review.

## 2019-03-10 NOTE — Progress Notes (Signed)
CC'ED TO PCP 

## 2019-03-12 ENCOUNTER — Telehealth: Payer: Self-pay

## 2019-03-12 NOTE — Telephone Encounter (Signed)
I spoke to pt's daughter, Ignatius Specking and she will pick up the paperwork at front to have completed and return so we can sent in to [facility] for the [place].

## 2019-03-20 NOTE — Telephone Encounter (Signed)
Pt has returned all Paperwork and income papers. I have faxed it all to [facility].

## 2019-03-22 ENCOUNTER — Other Ambulatory Visit: Payer: Self-pay

## 2019-03-22 NOTE — Patient Outreach (Signed)
[facility]) Care Management  03/22/2019  ANTON BICKNESE [birth year] [id number]   Medication Adherence call to Mr. Darrow Bussing HIPPA Compliant Voice message left with a call back number. Mr. Camarena is showing past due on Metformin 1000 mg and Lisinopril 5 mg under [place].   [place] Management Direct Dial [phone]  Fax [phone] [email or web address]

## 2019-04-02 ENCOUNTER — Ambulatory Visit (INDEPENDENT_AMBULATORY_CARE_PROVIDER_SITE_OTHER): Payer: Medicare Other | Admitting: Nurse Practitioner

## 2019-04-02 ENCOUNTER — Encounter: Payer: Self-pay | Admitting: Nurse Practitioner

## 2019-04-02 ENCOUNTER — Other Ambulatory Visit: Payer: Self-pay

## 2019-04-02 ENCOUNTER — Encounter: Payer: Self-pay | Admitting: Internal Medicine

## 2019-04-02 VITALS — BP 159/78 | HR 54 | Temp 97.0°F | Ht 70.5 in | Wt 154.4 lb

## 2019-04-02 DIAGNOSIS — B182 Chronic viral hepatitis C: Secondary | ICD-10-CM | POA: Diagnosis not present

## 2019-04-02 NOTE — Assessment & Plan Note (Signed)
Chronic hepatitis C genotype 1a.  See HPI for other specifics.  The patient was approved for Epclusa treatment.  We are waiting on support path to call us back regarding patient assistance for the cost/share of cost of his medication.  We had an extensive discussion about Epclusa with specifics including how to take the medication, how often, avoiding any new medications without calling our office first.  His medication will be shipped here and at that point he can pick it up and signed an agreement to not take any new medicines without checking with [place] first.  We will plan to recheck labs including CBC, CMP, hepatitis C RNA at 4 weeks after starting treatment.  We will schedule a follow-up visit for labs and follow-up 12 weeks after starting treatment/at end of treatment.  At that point we can schedule follow-up 3 months after completing treatment to document SVR-12.

## 2019-04-02 NOTE — Patient Instructions (Signed)
Your health issues we discussed today were:   Hepatitis C infection: 1. We are working with patient assistance to help with affordability of your hepatitis C medication 2. We will call you when we hear back from them 3. Your medication will be shipped to our office and you will need to come pick it up 4. We will plan to update labs 4 weeks after starting Epclusa 5. We will also plan for an office visit and labs at the end of treatment (12 weeks after starting treatment) 6. Please call us if you have any questions or problems with your medication  Overall I recommend:  1. Continue your other current medications 2. Call us if you have any questions or concerns 3. Follow-up as per above (3 months after starting treatment)   Because of recent events of COVID-19 ("Coronavirus"), follow CDC recommendations:  Wash your hand frequently Avoid touching your face Stay away from people who are sick If you have symptoms such as fever, cough, shortness of breath then call your healthcare provider for further guidance If you are sick, STAY AT HOME unless otherwise directed by your healthcare provider. Follow directions from state and national officials regarding staying safe   At [facility] we value your feedback. You may receive a survey about your visit today. Please share your experience as we strive to create trusting relationships with our patients to provide genuine, compassionate, quality care.  We appreciate your understanding and patience as we review any laboratory studies, imaging, and other diagnostic tests that are ordered as we care for you. Our office policy is 5 business days for review of these results, and any emergent or urgent results are addressed in a timely manner for your best interest. If you do not hear from our office in 1 week, please contact us.   We also encourage the use of MyChart, which contains your medical information for your review as well. If you  are not enrolled in this feature, an access code is on this after visit summary for your convenience. Thank you for allowing [place] to be involved in your care.  It was great to see you today!  I hope you have a great Fall!!

## 2019-04-02 NOTE — Progress Notes (Signed)
Referring Provider: Curlene Labrum, MD Primary Care Physician:  Curlene Labrum, MD Primary GI:  Dr. Gala Romney  Chief Complaint  Patient presents with  . Hepatitis C    HPI:   Ryan King is a 63 y.o. male who presents for follow-up on chronic hepatitis C.  Patient was last seen in our office 03/06/2019 for the same.  Through-transmission upper cohort or possible intranasal drug use.  No longer using drugs.  We have had some difficulties in getting labs completed.  Possibly loss of the nail versus his admitted difficulty remembering that have been done.  Liver ultrasound was also not completed.  At his last visit labs had still not been completed, although initially ordered 10/03/2018.  Right upper quadrant ultrasound completed which showed Matavir of F2 and some F3.  At his last visit he was doing okay, no overt GI symptoms.  Colonoscopy up-to-date next due in 2021 due to high risk with a brother with CRC and for polyps in 2018.  Labs are finally received which showed hepatitis C positive antibody confirmed with RNA at 4.2 million copies, genotype 1a, immunity for hepatitis B based on natural infection but no active infection.  HIV was negative.    At the end of his last visit I discussed the process for hepatitis C treatment with him.  Requirement to sign statement of agreement to not start any new medications while on hep C treatment.  Take medication every day at the same time every day without missing a dose.  Call us for any questions or concerns.  Imperative to avoid all drugs and alcohol.   Per phone note 03/12/2019 the patient returned paperwork and income papers for Epclusa support path to obtain patient assistance for medication.  Today he states he's doing wel overall. No recent changes. Denies abdominal pain, N/V, hematochezia, melena, fever, chills, unintentional weight loss. Denies URI or flu-like symptoms. Denies loss of sense of taste or smell. Denies chest pain,  dyspnea, dizziness, lightheadedness, syncope, near syncope. Denies any other upper or lower GI symptoms.  Denies any drug or alcohol use since 12/2018  Past Medical History:  Diagnosis Date  . Arthritis   . Essential hypertension   . Irregular heart rate    saw Dr. Rozann Lesches 10/2016; PACs, brief PAT on Holter  . Type 2 diabetes mellitus with diabetic neuropathy ([place])    type 2    Past Surgical History:  Procedure Laterality Date  . ANTERIOR CERVICAL DECOMP/DISCECTOMY FUSION N/A 01/20/2017   Procedure: Anterior Cervical Discectomy Fusion - Cervical three-Cervical four - Cervical four-Cervical five, possible Cervical four Corpectomy;  Surgeon: Consuella Lose, MD;  Location: [facility];  Service: Neurosurgery;  Laterality: N/A;  . Arm Surgery Right 06/2016  . CATARACT EXTRACTION W/PHACO Left 05/01/2017   Procedure: CATARACT EXTRACTION PHACO AND INTRAOCULAR LENS PLACEMENT (IOC);  Surgeon: Tonny Branch, MD;  Location: [facility];  Service: Ophthalmology;  Laterality: Left;  CDE: 8.47  . COLONOSCOPY N/A 10/18/2016   Procedure: COLONOSCOPY;  Surgeon: Daneil Dolin, MD;  Location: [facility];  Service: Endoscopy;  Laterality: N/A;  10:00 AM  . EYE SURGERY     right eye cataract  . FRACTURE SURGERY Right 06/2016    Current Outpatient Medications  Medication Sig Dispense Refill  . glipiZIDE (GLUCOTROL XL) 5 MG 24 hr tablet Take 5 mg by mouth 2 (two) times daily before lunch and supper.     Marland Kitchen lisinopril (PRINIVIL,ZESTRIL) 5 MG tablet Take 1  tablet by mouth daily.    . metFORMIN (GLUCOPHAGE) 1000 MG tablet Take 1,000 mg by mouth 2 (two) times daily with a meal.    . Multiple Vitamin (MULTIVITAMIN) tablet Take 1 tablet by mouth daily.     No current facility-administered medications for this visit.     Allergies as of 04/02/2019  . (No Known Allergies)    Family History  Problem Relation Age of Onset  . Diabetes Mother   . Hypertension Mother   . Lung cancer Brother   . Colon cancer  Brother        he thinks so, but not sure  . Multiple sclerosis Brother   . Stroke Brother   . Diabetes Mellitus II Brother     Social History   Socioeconomic History  . Marital status: Single    Spouse name: Not on file  . Number of children: Not on file  . Years of education: Not on file  . Highest education level: Not on file  Occupational History  . Not on file  Social Needs  . Financial resource strain: Not on file  . Food insecurity    Worry: Not on file    Inability: Not on file  . Transportation needs    Medical: Not on file    Non-medical: Not on file  Tobacco Use  . Smoking status: Current Every Day Smoker    Packs/day: 1.00    Years: 45.00    Pack years: 45.00    Types: Cigarettes  . Smokeless tobacco: Never Used  Substance and Sexual Activity  . Alcohol use: Yes    Comment: As of 10/13//20: last drink in 12/19/2018; previously: Rarely-Once month  . Drug use: Yes    Types: "Crack" cocaine    Comment: As of 10/13//2020: none in 2+ months. Previously: As of 01/03/19 "not that much" "maybe months apart" last use a few days ago  . Sexual activity: Not on file  Lifestyle  . Physical activity    Days per week: Not on file    Minutes per session: Not on file  . Stress: Not on file  Relationships  . Social Herbalist on phone: Not on file    Gets together: Not on file    Attends religious service: Not on file    Active member of club or organization: Not on file    Attends meetings of clubs or organizations: Not on file    Relationship status: Not on file  Other Topics Concern  . Not on file  Social History Narrative  . Not on file    Review of Systems: General: Negative for anorexia, weight loss, fever, chills, fatigue, weakness. ENT: Negative for hoarseness, difficulty swallowing CV: Negative for chest pain, angina, palpitations, peripheral edema.  Respiratory: Negative for dyspnea at rest, cough, sputum, wheezing.  GI: See history of present  illness. Endo: Negative for unusual weight change.  Heme: Negative for bruising or bleeding. Allergy: Negative for rash or hives.   Physical Exam: BP (!) 159/78   Pulse (!) 54   Temp (!) 97 F (36.1 C) (Oral)   Ht 5' 10.5" (1.791 m)   Wt 154 lb 6.4 oz (70 kg)   BMI 21.84 kg/m  General:   Alert and oriented. Pleasant and cooperative. Well-nourished and well-developed.  Eyes:  Without icterus, sclera clear and conjunctiva pink.  Ears:  Normal auditory acuity. Cardiovascular:  S1, S2 present without murmurs appreciated. Extremities without clubbing or edema. Respiratory:  Clear to auscultation bilaterally. No wheezes, rales, or rhonchi. No distress.  Gastrointestinal:  +BS, soft, non-tender and non-distended. No HSM noted. No guarding or rebound. No masses appreciated.  Rectal:  Deferred  Musculoskalatal:  Symmetrical without gross deformities. Skin:  Intact without significant lesions or rashes. Neurologic:  Alert and oriented x4;  grossly normal neurologically. Psych:  Alert and cooperative. Normal mood and affect. Heme/Lymph/Immune: No excessive bruising noted.    04/02/2019 10:33 AM   Disclaimer: This note was dictated with voice recognition software. Similar sounding words can inadvertently be transcribed and may not be corrected upon review.

## 2019-04-23 ENCOUNTER — Telehealth: Payer: Self-pay

## 2019-04-23 DIAGNOSIS — B182 Chronic viral hepatitis C: Secondary | ICD-10-CM

## 2019-04-23 NOTE — Telephone Encounter (Signed)
I spoke to Ryan King at [employer].  She said due to pt having insurance we will need to send Rx to [facility] for the [place]. It will then require a PA.  She said it should be coved then. We will have to discuss with them as far as delivery Etc. Randall Hiss, please send Rx to [facility] and I will work on this.  Pt's daughter, Ryan King is aware.

## 2019-04-25 DIAGNOSIS — E1165 Type 2 diabetes mellitus with hyperglycemia: Secondary | ICD-10-CM | POA: Diagnosis not present

## 2019-04-25 DIAGNOSIS — E1122 Type 2 diabetes mellitus with diabetic chronic kidney disease: Secondary | ICD-10-CM | POA: Diagnosis not present

## 2019-04-25 DIAGNOSIS — E1140 Type 2 diabetes mellitus with diabetic neuropathy, unspecified: Secondary | ICD-10-CM | POA: Diagnosis not present

## 2019-04-26 NOTE — Telephone Encounter (Signed)
Is there a form for this to be faxed or completed for this Dispensing optician specialty pharmacy)? I don't remember ever just sending an Epic Rx to [facility].

## 2019-04-30 NOTE — Telephone Encounter (Signed)
I called [facility], they were having difficulty with their system. Will try back later.

## 2019-05-01 DIAGNOSIS — E1140 Type 2 diabetes mellitus with diabetic neuropathy, unspecified: Secondary | ICD-10-CM | POA: Diagnosis not present

## 2019-05-01 DIAGNOSIS — D126 Benign neoplasm of colon, unspecified: Secondary | ICD-10-CM | POA: Diagnosis not present

## 2019-05-01 DIAGNOSIS — Z23 Encounter for immunization: Secondary | ICD-10-CM | POA: Diagnosis not present

## 2019-05-02 NOTE — Telephone Encounter (Signed)
I called [facility] and was told that [place] is not covered. Ryan King will be covered but will need a PA. They will not know how much it costs until after the PA. I was told to call back and speak to someone @ [phone].

## 2019-05-06 NOTE — Telephone Encounter (Signed)
I called [phone] and spoke to [facility] in [place]. She has initiated an acct for this pt Acct # [id number]. We can fax the prescription to [phone].  Randall Hiss, if you will print the prescription for Epclusa I will fax it. They will send to office for pt. When the prescription is faxed I will need to call to get a PA. The number to call is [phone].  Their address is [employer] [facility] @ [address], [place] [address].  They were previously [place] and if that comes up it is OK.

## 2019-05-07 MED ORDER — SOFOSBUVIR-VELPATASVIR 400-100 MG PO TABS: 1.0000 | ORAL_TABLET | Freq: Every day | ORAL | 0 refills | Status: AC

## 2019-05-07 NOTE — Telephone Encounter (Signed)
Rx sent to [facility] in [place], [place].   Rx for Epclusa daily x 12 weeks (84 days).

## 2019-05-07 NOTE — Addendum Note (Signed)
Addended by: Gordy Levan, ERIC A on: 05/07/2019 09:22 AM   Modules accepted: Orders

## 2019-05-07 NOTE — Telephone Encounter (Signed)
I called pt's daughter, Ignatius Specking @ [phone] to let her have update on this. I could not leave a Vm.

## 2019-05-20 NOTE — Telephone Encounter (Signed)
I received and approval for the Epclusa and called [facility]. I was on phone for 40 min repeating the same information over and over and being transferred to different people. I finally had to hang up. I had asked what was going to be the cost to pt and also let them know that we wanted the medication delivered to our office so we can give special instructions.  I called and informed pt's daughter, Ryan King and she is going to see if they can call and find out the price and request delivery to the office.

## 2019-05-23 NOTE — Telephone Encounter (Signed)
LMOM to call.

## 2019-05-23 NOTE — Telephone Encounter (Signed)
Pt walked in office today 05/23/2019 @ 12:35 PM,  to discuss the letter he received from [employer] concerning his Hep C medication. A copy of the letter was made for DS who is working on medication for Pt. Pt is aware that DS will contact him when she reviews documents.

## 2019-05-23 NOTE — Telephone Encounter (Signed)
The letter from [facility] said they have been trying to get in touch with pt.   He is currently enrolled in a Medicaid/Medicare Savings Program.  In order to continue receiving these benefits the state needs to renew his coverage each year.   The state will send a letter with directions about what he needs to do to renew benefits.   He is to sign and return the Authorization of Assistance form in pre-paid return envelope.  I explained this to his daughter, Valinda Party.

## 2019-05-23 NOTE — Telephone Encounter (Signed)
The PA for Ryan King was approved with [id number].  I called [facility] to let them know we need it delivered here. I spoke to Cordy and she was going to try and get pt on 3 way call. She wanted him pay co pay of $3.64 over phone and get approval for medicine to come here.  She was aware to try his daughter if she could not get him.  She is aware we need it delivered here. I stayed on hold 30 min and had to hang up.

## 2019-05-27 NOTE — Telephone Encounter (Signed)
Called the daughter's number and VM not set up.  Call pt and left VM that it is very important for him to call our office about his meds.

## 2019-07-18 ENCOUNTER — Other Ambulatory Visit: Payer: Self-pay

## 2019-07-18 ENCOUNTER — Telehealth: Payer: Self-pay

## 2019-07-18 ENCOUNTER — Ambulatory Visit (INDEPENDENT_AMBULATORY_CARE_PROVIDER_SITE_OTHER): Payer: Medicare Other | Admitting: Nurse Practitioner

## 2019-07-18 ENCOUNTER — Telehealth: Payer: Self-pay | Admitting: Internal Medicine

## 2019-07-18 ENCOUNTER — Encounter: Payer: Self-pay | Admitting: Nurse Practitioner

## 2019-07-18 VITALS — BP 144/69 | HR 72 | Temp 97.7°F | Ht 70.5 in | Wt 148.6 lb

## 2019-07-18 DIAGNOSIS — B182 Chronic viral hepatitis C: Secondary | ICD-10-CM

## 2019-07-18 NOTE — Assessment & Plan Note (Signed)
Chronic hepatitis C.  He is cleared all her goals for treatment.  The insurance company was waiting for a phone call from him to pay a co-pay of $3 and change for his Epclusa.  Noticed the new year he does not have a co-pay.  They are needing a phone call from him to authorize him to shift to our office.  We have given him the phone number and asked that he please call.  Anticipate shipping on Tuesday and arrival sometime next week.  I discussed treatment with him including taking this medication once a day, same time every day.  I indicated we would call him when it is arrived in our office and he would sign paperwork agreeing to not take any new medications unless checking with [place] first.  I will put in for labs to recheck his CBC, CMP, viral RNA in 4 weeks after starting treatment.  Otherwise follow-up in 3 months.

## 2019-07-18 NOTE — Telephone Encounter (Signed)
Pt was in the office for visit with Walden Field, NP today. Pt is aware [facility] has been trying to contact them to deliver the medication. He said his daughter just gave birth to her first child and things have been hectic.  I called and spoke to [facility] and she said he will have a zero copay now. She is aware we would like it delivered here to have pt come by for special instructions. She said pt should call them and let them know it is OK to send it to our office.  I gave him the number to call [phone] and he said he will have his daughter call this afternoon. [facility] said it will be delivered on Tues 07/23/2019 if he calls them. PT is aware I will call and let him know what time to come by for the special instructions.

## 2019-07-18 NOTE — Progress Notes (Signed)
Cc'ed to pcp °

## 2019-07-18 NOTE — Telephone Encounter (Signed)
Roderic Palau from [facility] called to say that patient's medication would be delivered on Monday 07/22/2019 before 5 pm

## 2019-07-18 NOTE — Progress Notes (Signed)
Referring Provider: Curlene Labrum, MD Primary Care Physician:  Curlene Labrum, MD Primary GI:  Dr. Gala Romney  Chief Complaint  Patient presents with  . Hepatitis C    suppose to get medication/ had not returned out calls    HPI:   Ryan King is a 64 y.o. male who presents for follow-up on hepatitis C.  Patient was last seen in our office 04/02/2019 for the same.  Noted chronic history of hepatitis C possibly due to drug use, no longer using drugs.  Difficulty getting labs completed and ultrasound completed.  Colonoscopy up-to-date and next due in 2021 due to high risk of CRC for family history of colon cancer in his brother.  His last colonoscopy was in 2018 which had polyps.  Labs were eventually completed that confirmed hepatitis C with RNA at 4.2 million copies, genotype 1a, immunity for hepatitis B based on natural infection but no active infection.  HIV was negative.  We submitted paperwork for patient assistance for Epclusa which the patient returned.  At his last visit doing well, no overt GI complaints.  No drugs or alcohol since July 2020.  Recommended continued patients while we work through the patient system process at which point we will notify him of medication has been shipped.  Informed he would be required to sign a paper with his list of medications and agree not to take anything else without calling us.  He would need to pick up the medication from our office.  He would need updated labs 12 weeks after starting treatment.  Follow-up in 3 months.  After somewhat lengthy prior authorization process Raeanne Gathers was approved by [facility].  We tried to get a hold the patient had some difficulty.  Today he states he's doing well overall. Denies abdominal pain, N/V, hematochezia, melena, fever, chills, unintentional weight loss. Has intermittent diarrhea with Metformin. Denies yellowing of skin/eyes, darkened urine, generalized pruritis, acute episodic confusion, tremors/shakes.  Denies URI or flu-like symptoms. Denies loss of sense of taste or smell. Denies chest pain, dyspnea, dizziness, lightheadedness, syncope, near syncope. Denies any other upper or lower GI symptoms.  **NOTE: A total of 30-35 minutes was spent on the patient's care on the date of service**  Past Medical History:  Diagnosis Date  . Arthritis   . Essential hypertension   . Irregular heart rate    saw Dr. Rozann Lesches 10/2016; PACs, brief PAT on Holter  . Type 2 diabetes mellitus with diabetic neuropathy ([place])    type 2    Past Surgical History:  Procedure Laterality Date  . ANTERIOR CERVICAL DECOMP/DISCECTOMY FUSION N/A 01/20/2017   Procedure: Anterior Cervical Discectomy Fusion - Cervical three-Cervical four - Cervical four-Cervical five, possible Cervical four Corpectomy;  Surgeon: Consuella Lose, MD;  Location: [facility];  Service: Neurosurgery;  Laterality: N/A;  . Arm Surgery Right 06/2016  . CATARACT EXTRACTION W/PHACO Left 05/01/2017   Procedure: CATARACT EXTRACTION PHACO AND INTRAOCULAR LENS PLACEMENT (IOC);  Surgeon: Tonny Branch, MD;  [facility];  Service: Ophthalmology;  Laterality: Left;  CDE: 8.47  . COLONOSCOPY N/A 10/18/2016   Procedure: COLONOSCOPY;  Surgeon: Daneil Dolin, MD;  [facility];  Service: Endoscopy;  Laterality: N/A;  10:00 AM  . EYE SURGERY     right eye cataract  . FRACTURE SURGERY Right 06/2016    Current Outpatient Medications  Medication Sig Dispense Refill  . atorvastatin (LIPITOR) 20 MG tablet Take 20 mg by mouth daily. Takes one  twice a week    . bismuth subsalicylate (PEPTO BISMOL) 262 MG/15ML suspension Take 30 mLs by mouth as needed.    Marland Kitchen glipiZIDE (GLUCOTROL XL) 5 MG 24 hr tablet Take 5 mg by mouth 2 (two) times daily before lunch and supper.     Marland Kitchen ibuprofen (ADVIL) 200 MG tablet Take 200 mg by mouth as needed.    Marland Kitchen lisinopril (PRINIVIL,ZESTRIL) 5 MG tablet Take 1 tablet by mouth daily.    . metFORMIN (GLUCOPHAGE) 1000 MG tablet  Take 1,000 mg by mouth 2 (two) times daily with a meal.    . Multiple Vitamin (MULTIVITAMIN) tablet Take 1 tablet by mouth daily.    . Sofosbuvir-Velpatasvir (EPCLUSA) 400-100 MG TABS Take 1 tablet by mouth daily. (Patient not taking: Reported on 07/18/2019) 84 tablet 0   No current facility-administered medications for this visit.    Allergies as of 07/18/2019  . (No Known Allergies)    Family History  Problem Relation Age of Onset  . Diabetes Mother   . Hypertension Mother   . Lung cancer Brother   . Colon cancer Brother        he thinks so, but not sure  . Multiple sclerosis Brother   . Stroke Brother   . Diabetes Mellitus II Brother     Social History   Socioeconomic History  . Marital status: Single    Spouse name: Not on file  . Number of children: Not on file  . Years of education: Not on file  . Highest education level: Not on file  Occupational History  . Not on file  Tobacco Use  . Smoking status: Current Every Day Smoker    Packs/day: 1.00    Years: 45.00    Pack years: 45.00    Types: Cigarettes  . Smokeless tobacco: Never Used  . Tobacco comment: some days does not smoke  Substance and Sexual Activity  . Alcohol use: Yes    Comment: As of 10/13//20: last drink in 12/19/2018; previously: Rarely-Once month  . Drug use: Yes    Types: "Crack" cocaine    Comment: As of 10/13//2020: none in 2+ months. Previously: As of 01/03/19 "not that much" "maybe months apart" last use a few days ago  . Sexual activity: Not on file  Other Topics Concern  . Not on file  Social History Narrative  . Not on file   Social Determinants of Health   Financial Resource Strain:   . Difficulty of Paying Living Expenses: Not on file  Food Insecurity:   . Worried About Charity fundraiser in the Last Year: Not on file  . Ran Out of Food in the Last Year: Not on file  Transportation Needs:   . Lack of Transportation (Medical): Not on file  . Lack of Transportation (Non-Medical):  Not on file  Physical Activity:   . Days of Exercise per Week: Not on file  . Minutes of Exercise per Session: Not on file  Stress:   . Feeling of Stress : Not on file  Social Connections:   . Frequency of Communication with Friends and Family: Not on file  . Frequency of Social Gatherings with Friends and Family: Not on file  . Attends Religious Services: Not on file  . Active Member of Clubs or Organizations: Not on file  . Attends Archivist Meetings: Not on file  . Marital Status: Not on file    Review of Systems: General: Negative for anorexia, weight loss,  fever, chills, fatigue, weakness. ENT: Negative for hoarseness, difficulty swallowing. CV: Negative for chest pain, angina, palpitations, peripheral edema.  Respiratory: Negative for dyspnea at rest, cough, sputum, wheezing.  GI: See history of present illness. Derm: Negative for rash or itching.  Neuro: Negative for memory loss, confusion.  Endo: Negative for unusual weight change.  Heme: Negative for bruising or bleeding. Allergy: Negative for rash or hives.   Physical Exam: BP (!) 144/69   Pulse 72   Temp 97.7 F (36.5 C) (Temporal)   Ht 5' 10.5" (1.791 m)   Wt 148 lb 9.6 oz (67.4 kg)   BMI 21.02 kg/m  General:   Alert and oriented. Pleasant and cooperative. Well-nourished and well-developed.  Eyes:  Without icterus, sclera clear and conjunctiva pink.  Ears:  Normal auditory acuity. Cardiovascular:  S1, S2 present without murmurs appreciated. Extremities without clubbing or edema. Respiratory:  Clear to auscultation bilaterally. No wheezes, rales, or rhonchi. No distress.  Gastrointestinal:  +BS, soft, non-tender and non-distended. No HSM noted. No guarding or rebound. No masses appreciated.  Rectal:  Deferred  Musculoskalatal:  Symmetrical without gross deformities. Neurologic:  Alert and oriented x4;  grossly normal neurologically. Psych:  Alert and cooperative. Normal mood and  affect. Heme/Lymph/Immune: No excessive bruising noted.    07/18/2019 1:53 PM   Disclaimer: This note was dictated with voice recognition software. Similar sounding words can inadvertently be transcribed and may not be corrected upon review.

## 2019-07-18 NOTE — Patient Instructions (Signed)
Your health issues we discussed today were:   Hepatitis C: 1. We have gotten approval for your medication 2. You need to call [employer] (associated with [place] [facility]) and give them permission to shipped to our office 3. They anticipate should be next Tuesday which would arrive sometime next week 4. We will call you when we receive your medication for you to come to our office and pick it up.  As we discussed, you will need to sign paperwork indicating you will not take any new medications (prescription, over-the-counter, herbs, supplement, etc.) without checking with our office first 5. Take Epclusa once a day, the same time every day 6. Try not to miss any doses 7. Call us if you have any questions about your medication 8. You will need to have labs 4 weeks after starting your medications.  We will put these orders in today) to slips for you for the future  Overall I recommend:  1. Continue your other current medications 2. Return for follow-up in 3 months 3. Call us if you have any questions or concerns   ---------------------------------------------------------------  COVID-19 Vaccine Information can be found at: [email or web address] For questions related to vaccine distribution or appointments, please email [email or web address] or call [phone].   ---------------------------------------------------------------   At [facility] we value your feedback. You may receive a survey about your visit today. Please share your experience as we strive to create trusting relationships with our patients to provide genuine, compassionate, quality care.  We appreciate your understanding and patience as we review any laboratory studies, imaging, and other diagnostic tests that are ordered as we care for you. Our office policy is 5 business days for review of these results, and any emergent or urgent  results are addressed in a timely manner for your best interest. If you do not hear from our office in 1 week, please contact us.   We also encourage the use of MyChart, which contains your medical information for your review as well. If you are not enrolled in this feature, an access code is on this after visit summary for your convenience. Thank you for allowing [place] to be involved in your care.  It was great to see you today!  I hope you have a great day!!

## 2019-07-18 NOTE — Telephone Encounter (Signed)
Noted - no further recommendations at this time.

## 2019-07-18 NOTE — Telephone Encounter (Signed)
FYI, routing to DS

## 2019-07-18 NOTE — Telephone Encounter (Signed)
Noted!  Randall Hiss, do you want to leave me the instructions for the pt so when his meds come I can call him and get him started?

## 2019-07-19 NOTE — Telephone Encounter (Addendum)
Instructions to print for patient to take Epclusa. Please have him sign agreement (per process) to not start any medications (Rx, OTC, herb, supplement, etc) without checking with [place] first.  How to take Epclusa: 1. Do not stop taking Epclusa without first notifying us to discuss why you are wanting to stop taking Epclusa. 2. Take 1 Epclusa tablet, once a day, at the same time every day. 3. Your can take Epclusa with or without food. 4. Do not miss or skip any doses of Epclusa during treatment. 5. If you take too much Epclusa , notify us or go to the nearest emergency room.  DO NOT start any new medications, whether prescription or over-the-counter, no herbs, supplements, any other treatments before checking with [place] first.

## 2019-07-23 ENCOUNTER — Telehealth: Payer: Self-pay | Admitting: Internal Medicine

## 2019-07-23 NOTE — Telephone Encounter (Signed)
DS will take call when patient calls back at 230 pm

## 2019-07-23 NOTE — Telephone Encounter (Signed)
Ryan King is aware to let me know when pt calls back.

## 2019-07-23 NOTE — Telephone Encounter (Signed)
Pt called to see if his medication was here for him to pick up. I told him I would have to check with AM because I didn't have anything up front for him. He said he would call back at 230pm

## 2019-07-25 NOTE — Telephone Encounter (Signed)
See previous note. Pt has picked up his Epclusa.

## 2019-07-25 NOTE — Telephone Encounter (Signed)
LATE ENTRY:  Pt came by yesterday ( 07/24/2019) about 1:30 pm and picked up his medication and all of instructions and signed forms.

## 2019-07-26 NOTE — Telephone Encounter (Signed)
Noted, please make sure he has a 4 week appointment OR labs. Let me know if orders needed.

## 2019-07-29 DIAGNOSIS — Z13220 Encounter for screening for lipoid disorders: Secondary | ICD-10-CM | POA: Diagnosis not present

## 2019-07-29 DIAGNOSIS — R946 Abnormal results of thyroid function studies: Secondary | ICD-10-CM | POA: Diagnosis not present

## 2019-07-29 DIAGNOSIS — E1122 Type 2 diabetes mellitus with diabetic chronic kidney disease: Secondary | ICD-10-CM | POA: Diagnosis not present

## 2019-07-29 NOTE — Telephone Encounter (Signed)
His next appointment is 10/17/2019.  He has future labs due around 08/18/2019 for HCV RNA, CMP and CBC.

## 2019-07-30 NOTE — Telephone Encounter (Signed)
Perfect, thanks 

## 2019-07-31 ENCOUNTER — Other Ambulatory Visit: Payer: Self-pay

## 2019-07-31 DIAGNOSIS — B182 Chronic viral hepatitis C: Secondary | ICD-10-CM

## 2019-07-31 NOTE — Telephone Encounter (Signed)
Lab orders are being mailed to pt to do around 08/21/2019.

## 2019-08-01 DIAGNOSIS — E782 Mixed hyperlipidemia: Secondary | ICD-10-CM | POA: Diagnosis not present

## 2019-08-01 DIAGNOSIS — E1140 Type 2 diabetes mellitus with diabetic neuropathy, unspecified: Secondary | ICD-10-CM | POA: Diagnosis not present

## 2019-08-01 DIAGNOSIS — D126 Benign neoplasm of colon, unspecified: Secondary | ICD-10-CM | POA: Diagnosis not present

## 2019-08-14 ENCOUNTER — Telehealth: Payer: Self-pay | Admitting: Internal Medicine

## 2019-08-14 DIAGNOSIS — J439 Emphysema, unspecified: Secondary | ICD-10-CM | POA: Diagnosis not present

## 2019-08-14 DIAGNOSIS — Z122 Encounter for screening for malignant neoplasm of respiratory organs: Secondary | ICD-10-CM | POA: Diagnosis not present

## 2019-08-14 DIAGNOSIS — F17210 Nicotine dependence, cigarettes, uncomplicated: Secondary | ICD-10-CM | POA: Diagnosis not present

## 2019-08-14 DIAGNOSIS — I700 Atherosclerosis of aorta: Secondary | ICD-10-CM | POA: Diagnosis not present

## 2019-08-14 NOTE — Telephone Encounter (Signed)
Spoke with pt a PA is needed from [place]. [place] and submitted a PA via phone. They will contact me with an approval or denial in 1-2 days. Pt is aware that a PA was submitted and I'll contact him when I hear from [place]. PA# [id number], spoke with [place].

## 2019-08-14 NOTE — Telephone Encounter (Signed)
Pt has questions about getting his medication (doesn't know the name) refilled thru [facility]. [phone]

## 2019-08-19 NOTE — Telephone Encounter (Signed)
Received a VM from [employer] [phone]. Pt's Hep C medication will be delivered on Wednesday 08/21/2019.

## 2019-08-21 NOTE — Telephone Encounter (Signed)
Pt's Hep C medication arrived today (2nd shipment). Pt is aware and will pick medication up soon.

## 2019-08-24 LAB — COMPREHENSIVE METABOLIC PANEL
AG Ratio: 1.3 (calc) (ref 1.0–2.5)
ALT: 10 U/L (ref 9–46)
AST: 14 U/L (ref 10–35)
Albumin: 3.4 g/dL — ABNORMAL LOW (ref 3.6–5.1)
Alkaline phosphatase (APISO): 59 U/L (ref 35–144)
BUN: 11 mg/dL (ref 7–25)
CO2: 27 mmol/L (ref 20–32)
Calcium: 9.7 mg/dL (ref 8.6–10.3)
Chloride: 104 mmol/L (ref 98–110)
Creat: 0.74 mg/dL (ref 0.70–1.25)
Globulin: 2.6 g/dL (calc) (ref 1.9–3.7)
Glucose, Bld: 326 mg/dL — ABNORMAL HIGH (ref 65–99)
Potassium: 4.2 mmol/L (ref 3.5–5.3)
Sodium: 139 mmol/L (ref 135–146)
Total Bilirubin: 0.7 mg/dL (ref 0.2–1.2)
Total Protein: 6 g/dL — ABNORMAL LOW (ref 6.1–8.1)

## 2019-08-24 LAB — CBC WITH DIFFERENTIAL/PLATELET
Absolute Monocytes: 583 cells/uL (ref 200–950)
Basophils Absolute: 65 cells/uL (ref 0–200)
Basophils Relative: 0.6 %
Eosinophils Absolute: 227 cells/uL (ref 15–500)
Eosinophils Relative: 2.1 %
HCT: 43.6 % (ref 38.5–50.0)
Hemoglobin: 14.5 g/dL (ref 13.2–17.1)
Lymphs Abs: 4115 cells/uL — ABNORMAL HIGH (ref 850–3900)
MCH: 31.1 pg (ref 27.0–33.0)
MCHC: 33.3 g/dL (ref 32.0–36.0)
MCV: 93.6 fL (ref 80.0–100.0)
MPV: 10.9 fL (ref 7.5–12.5)
Monocytes Relative: 5.4 %
Neutro Abs: 5810 cells/uL (ref 1500–7800)
Neutrophils Relative %: 53.8 %
Platelets: 262 10*3/uL (ref 140–400)
RBC: 4.66 10*6/uL (ref 4.20–5.80)
RDW: 11.8 % (ref 11.0–15.0)
Total Lymphocyte: 38.1 %
WBC: 10.8 10*3/uL (ref 3.8–10.8)

## 2019-08-24 LAB — HCV RNA, QUANT REAL-TIME PCR W/REFLEX
HCV RNA, PCR, QN (Log): <1.18 LogIU/mL
HCV RNA, PCR, QN: <15 IU/mL

## 2019-09-16 NOTE — Telephone Encounter (Signed)
Pt's medication has arrived. Tried calling pts daughter, VM is full, not able to leave a message. Spoke with pts friend, she will have pt contact our office.

## 2019-10-17 ENCOUNTER — Encounter: Payer: Self-pay | Admitting: Nurse Practitioner

## 2019-10-17 ENCOUNTER — Ambulatory Visit (INDEPENDENT_AMBULATORY_CARE_PROVIDER_SITE_OTHER): Payer: Medicare Other | Admitting: Nurse Practitioner

## 2019-10-17 ENCOUNTER — Other Ambulatory Visit: Payer: Self-pay

## 2019-10-17 VITALS — BP 146/79 | HR 98 | Temp 97.5°F | Ht 70.0 in | Wt 155.2 lb

## 2019-10-17 DIAGNOSIS — B182 Chronic viral hepatitis C: Secondary | ICD-10-CM

## 2019-10-17 DIAGNOSIS — Z86010 Personal history of colonic polyps: Secondary | ICD-10-CM

## 2019-10-17 DIAGNOSIS — Z860101 Personal history of adenomatous and serrated colon polyps: Secondary | ICD-10-CM

## 2019-10-17 MED ORDER — CLENPIQ 10-3.5-12 MG-GM -GM/160ML PO SOLN: 1.0000 | Freq: Once | ORAL | 0 refills | Status: AC

## 2019-10-17 NOTE — Progress Notes (Signed)
CC'ED TO PCP 

## 2019-10-17 NOTE — Addendum Note (Signed)
Addended by: Gordy Levan, Aerin Delany A on: 10/17/2019 03:02 PM   Modules accepted: Orders

## 2019-10-17 NOTE — Progress Notes (Signed)
Referring Provider: Curlene Labrum, MD Primary Care Physician:  Curlene Labrum, MD Primary GI:  Dr. Gala Romney  Chief Complaint  Patient presents with  . Hepatitis C    took last epclusa yesterday  . Consult    due for 3 yr TCS    HPI:   Ryan King is a 64 y.o. male who presents for 39-month follow-up and to schedule colonoscopy.  Patient was last seen in our office 07/18/2019 for chronic hep C.  Noted history of chronic hepatitis C possibly due to drug use, no longer using drugs.  Initially had difficulty with labs and ultrasound completion.  Colonoscopy up-to-date and due in 2021 due to high risk for CRC due to family history of colon cancer in his brother.  Last colonoscopy 2018 with no polyps.  Hep C work-up was eventually completed and found confirmation of with RNA at 4.2 million copies, genotype 1a, immunity for hepatitis B based on actual infection but no active infection.  HIV negative.  Paperwork was submitted for Epclusa. After a lengthy prior authorization process, Raeanne Gathers was approved by [employer].  His last visit he denied any overt GI complaints other than intermittent diarrhea with Metformin.  No hepatic symptoms.  We had an extensive discussion and education session on hepatitis C, pathophysiology, and treatment.  I informed him we got approval for his medication but he is required to call [employer] to give them permission to sit to our office.  We would call when it was arrived to have him pick up the medication.  Recommended Epclusa once a day, same time every day, do not miss doses.  Need repeat labs in 4 weeks after starting medications and orders were entered for these.  Follow-up in 3 months in our office.  Updated labs completed 08/20/2019 which showed hepatitis C RNA not detected.  CMP essentially normal, but significantly elevated glucose.  CBC essentially normal.  Recommend he continue his medication and follow-up as originally planned.  Today he states he's doing ok  overall. He finished his Epclusa yesterday. Took all his medication, didn't miss any doses. No significant side effects. Time for a colonoscopy. He states he is having hip problems, has had neck surgery as well. Denies abdominal pain, N/V, hematochezia, melena, fever, chills, unintentional weight loss. Denies yellowing of skin/eyes, darkened urine, acute episodic confusion, generalized pruritis, tremors/shakes. Denies URI or flu-like symptoms. Denies loss of sense of taste or smell. He has not had a COVID-19 vaccine. Doesn't want information on the vaccine today. Denies chest pain, dyspnea, dizziness, lightheadedness, syncope, near syncope. Denies any other upper or lower GI symptoms.  Past Medical History:  Diagnosis Date  . Arthritis   . Essential hypertension   . Irregular heart rate    saw Dr. Rozann Lesches 10/2016; PACs, brief PAT on Holter  . Type 2 diabetes mellitus with diabetic neuropathy ([place])    type 2    Past Surgical History:  Procedure Laterality Date  . ANTERIOR CERVICAL DECOMP/DISCECTOMY FUSION N/A 01/20/2017   Procedure: Anterior Cervical Discectomy Fusion - Cervical three-Cervical four - Cervical four-Cervical five, possible Cervical four Corpectomy;  Surgeon: Consuella Lose, MD;  Location: [facility];  Service: Neurosurgery;  Laterality: N/A;  . Arm Surgery Right 06/2016  . CATARACT EXTRACTION W/PHACO Left 05/01/2017   Procedure: CATARACT EXTRACTION PHACO AND INTRAOCULAR LENS PLACEMENT (IOC);  Surgeon: Tonny Branch, MD;  [facility];  Service: Ophthalmology;  Laterality: Left;  CDE: 8.47  . COLONOSCOPY N/A 10/18/2016  Procedure: COLONOSCOPY;  Surgeon: Daneil Dolin, MD;  [facility];  Service: Endoscopy;  Laterality: N/A;  10:00 AM  . EYE SURGERY     right eye cataract  . FRACTURE SURGERY Right 06/2016    Current Outpatient Medications  Medication Sig Dispense Refill  . glipiZIDE (GLUCOTROL XL) 5 MG 24 hr tablet Take 5 mg by mouth 2 (two) times daily  before lunch and supper.     Marland Kitchen lisinopril (PRINIVIL,ZESTRIL) 5 MG tablet Take 1 tablet by mouth daily.    . metFORMIN (GLUCOPHAGE) 1000 MG tablet Take 1,000 mg by mouth 2 (two) times daily with a meal.    . Multiple Vitamin (MULTIVITAMIN) tablet Take 1 tablet by mouth daily.     No current facility-administered medications for this visit.    Allergies as of 10/17/2019  . (No Known Allergies)    Family History  Problem Relation Age of Onset  . Diabetes Mother   . Hypertension Mother   . Lung cancer Brother   . Colon cancer Brother        he thinks so, but not sure  . Multiple sclerosis Brother   . Stroke Brother   . Diabetes Mellitus II Brother     Social History   Socioeconomic History  . Marital status: Single    Spouse name: Not on file  . Number of children: Not on file  . Years of education: Not on file  . Highest education level: Not on file  Occupational History  . Not on file  Tobacco Use  . Smoking status: Current Every Day Smoker    Packs/day: 1.00    Years: 45.00    Pack years: 45.00    Types: Cigarettes  . Smokeless tobacco: Never Used  . Tobacco comment: some days does not smoke  Substance and Sexual Activity  . Alcohol use: Yes    Comment: As of 10/13//20: last drink in 12/19/2018; previously: Rarely-Once month  . Drug use: Yes    Types: "Crack" cocaine    Comment: As of 10/13//2020: none in 2+ months. Previously: As of 01/03/19 "not that much" "maybe months apart" last use a few days ago  . Sexual activity: Not on file  Other Topics Concern  . Not on file  Social History Narrative  . Not on file   Social Determinants of Health   Financial Resource Strain:   . Difficulty of Paying Living Expenses:   Food Insecurity:   . Worried About Charity fundraiser in the Last Year:   . Arboriculturist in the Last Year:   Transportation Needs:   . Film/video editor (Medical):   Marland Kitchen Lack of Transportation (Non-Medical):   Physical Activity:   . Days of  Exercise per Week:   . Minutes of Exercise per Session:   Stress:   . Feeling of Stress :   Social Connections:   . Frequency of Communication with Friends and Family:   . Frequency of Social Gatherings with Friends and Family:   . Attends Religious Services:   . Active Member of Clubs or Organizations:   . Attends Archivist Meetings:   Marland Kitchen Marital Status:     Subjective: Review of Systems  Constitutional: Negative for chills, fever, malaise/fatigue and weight loss.  HENT: Negative for congestion and sore throat.   Respiratory: Negative for cough and shortness of breath.   Cardiovascular: Negative for chest pain and palpitations.  Gastrointestinal: Negative for abdominal pain, blood  in stool, diarrhea, melena, nausea and vomiting.  Musculoskeletal: Positive for joint pain and neck pain. Negative for myalgias.       Hip pain  Skin: Negative for rash.  Neurological: Negative for dizziness and weakness.  Endo/Heme/Allergies: Does not bruise/bleed easily.  Psychiatric/Behavioral: Negative for depression. The patient is not nervous/anxious.   All other systems reviewed and are negative.    Objective: BP (!) 146/79   Pulse 98   Temp (!) 97.5 F (36.4 C) (Oral)   Ht 5\' 10"  (1.778 m)   Wt 155 lb 3.2 oz (70.4 kg)   BMI 22.27 kg/m  Physical Exam Vitals and nursing note reviewed.  Constitutional:      General: He is not in acute distress.    Appearance: Normal appearance. He is not ill-appearing, toxic-appearing or diaphoretic.  HENT:     Head: Normocephalic and atraumatic.     Nose: No congestion or rhinorrhea.  Eyes:     General: No scleral icterus. Cardiovascular:     Rate and Rhythm: Normal rate and regular rhythm.     Heart sounds: Normal heart sounds.  Pulmonary:     Effort: Pulmonary effort is normal.     Breath sounds: Normal breath sounds.  Abdominal:     General: Bowel sounds are normal. There is no distension.     Palpations: Abdomen is soft. There is  no hepatomegaly, splenomegaly or mass.     Tenderness: There is no abdominal tenderness. There is no guarding or rebound.     Hernia: No hernia is present.  Musculoskeletal:     Cervical back: Neck supple.  Skin:    General: Skin is warm and dry.     Coloration: Skin is not jaundiced.     Findings: No bruising or rash.  Neurological:     General: No focal deficit present.     Mental Status: He is alert and oriented to person, place, and time. Mental status is at baseline.  Psychiatric:        Mood and Affect: Mood normal.        Behavior: Behavior normal.        Thought Content: Thought content normal.       10/17/2019 2:48 PM   Disclaimer: This note was dictated with voice recognition software. Similar sounding words can inadvertently be transcribed and may not be corrected upon review.

## 2019-10-17 NOTE — Assessment & Plan Note (Signed)
Chronic hepatitis C status post treatment with Epclusa.  He just finished his medication yesterday.  At this point I will check end of treatment labs including CBC, CMP, HCVRNA.  I discussed that "cure" is determined by no hepatitis C RNA 3 months after completion of treatment.  Recommend follow-up in 3 months we can touch base and schedule his final labs.  Further recommendations to follow his eventual lab results.

## 2019-10-17 NOTE — Assessment & Plan Note (Signed)
History of multiple polypectomies on colonoscopy.  Last colonoscopy 2018 with a recommended repeat in 3 years.  He is currently due.  He is generally asymptomatic from a GI standpoint.  At this point we will proceed with colonoscopy as previously recommended.  Proceed with TCS on propofol/MAC with Dr. Gala Romney in near future: the risks, benefits, and alternatives have been discussed with the patient in detail. The patient states understanding and desires to proceed.  ASA II-III (history of cocaine use; last use 3 months ago)  The patient is not on any other anticoagulants, anxiolytics, chronic pain medications, antidepressants, antidiabetics, or iron supplements.  Does admit cocaine use, last use about 2 to 3 months ago.  No alcohol since July 2020.  We will proceed with the procedure on propofol/MAC to promote adequate sedation.

## 2019-10-17 NOTE — Patient Instructions (Signed)
Your health issues we discussed today were:   Hepatitis C: 1. Have your labs completed as soon as you can 2. We will call you when we get the results 3. We will need 1 final set of labs in 3 months at your next office visit 4. Call us if you have any worsening or significant symptoms  Need for colonoscopy: 1. We will schedule your colonoscopy for you 2. Further recommendations will follow your colonoscopy 3. Call us if you have any questions or significant symptoms  Overall I recommend:  1. Continue your other current medications 2. Return for follow-up in 3 months 3. Call us if you have any questions or concerns.    At [facility] we value your feedback. You may receive a survey about your visit today. Please share your experience as we strive to create trusting relationships with our patients to provide genuine, compassionate, quality care.  We appreciate your understanding and patience as we review any laboratory studies, imaging, and other diagnostic tests that are ordered as we care for you. Our office policy is 5 business days for review of these results, and any emergent or urgent results are addressed in a timely manner for your best interest. If you do not hear from our office in 1 week, please contact us.   We also encourage the use of MyChart, which contains your medical information for your review as well. If you are not enrolled in this feature, an access code is on this after visit summary for your convenience. Thank you for allowing [place] to be involved in your care.  It was great to see you today!  I hope you have a great Summer!!

## 2019-10-28 DIAGNOSIS — E1165 Type 2 diabetes mellitus with hyperglycemia: Secondary | ICD-10-CM | POA: Diagnosis not present

## 2019-10-28 DIAGNOSIS — E782 Mixed hyperlipidemia: Secondary | ICD-10-CM | POA: Diagnosis not present

## 2019-10-28 DIAGNOSIS — E1140 Type 2 diabetes mellitus with diabetic neuropathy, unspecified: Secondary | ICD-10-CM | POA: Diagnosis not present

## 2019-10-28 DIAGNOSIS — E1122 Type 2 diabetes mellitus with diabetic chronic kidney disease: Secondary | ICD-10-CM | POA: Diagnosis not present

## 2019-11-01 DIAGNOSIS — J439 Emphysema, unspecified: Secondary | ICD-10-CM | POA: Diagnosis not present

## 2019-11-01 DIAGNOSIS — M79641 Pain in right hand: Secondary | ICD-10-CM | POA: Diagnosis not present

## 2019-11-01 DIAGNOSIS — M25511 Pain in right shoulder: Secondary | ICD-10-CM | POA: Diagnosis not present

## 2019-11-01 DIAGNOSIS — Z79899 Other long term (current) drug therapy: Secondary | ICD-10-CM | POA: Diagnosis not present

## 2019-11-01 DIAGNOSIS — S59911A Unspecified injury of right forearm, initial encounter: Secondary | ICD-10-CM | POA: Diagnosis not present

## 2019-11-01 DIAGNOSIS — Z1389 Encounter for screening for other disorder: Secondary | ICD-10-CM | POA: Diagnosis not present

## 2019-11-01 DIAGNOSIS — W19XXXA Unspecified fall, initial encounter: Secondary | ICD-10-CM | POA: Diagnosis not present

## 2019-11-01 DIAGNOSIS — Z7984 Long term (current) use of oral hypoglycemic drugs: Secondary | ICD-10-CM | POA: Diagnosis not present

## 2019-11-01 DIAGNOSIS — M25531 Pain in right wrist: Secondary | ICD-10-CM | POA: Diagnosis not present

## 2019-11-01 DIAGNOSIS — E1140 Type 2 diabetes mellitus with diabetic neuropathy, unspecified: Secondary | ICD-10-CM | POA: Diagnosis not present

## 2019-11-01 DIAGNOSIS — M79601 Pain in right arm: Secondary | ICD-10-CM | POA: Diagnosis not present

## 2019-11-01 DIAGNOSIS — Z23 Encounter for immunization: Secondary | ICD-10-CM | POA: Diagnosis not present

## 2019-11-01 DIAGNOSIS — M1990 Unspecified osteoarthritis, unspecified site: Secondary | ICD-10-CM | POA: Diagnosis not present

## 2019-11-01 DIAGNOSIS — I10 Essential (primary) hypertension: Secondary | ICD-10-CM | POA: Diagnosis not present

## 2019-11-01 DIAGNOSIS — S4991XA Unspecified injury of right shoulder and upper arm, initial encounter: Secondary | ICD-10-CM | POA: Diagnosis not present

## 2019-11-01 DIAGNOSIS — Z0001 Encounter for general adult medical examination with abnormal findings: Secondary | ICD-10-CM | POA: Diagnosis not present

## 2019-11-04 DIAGNOSIS — Z86010 Personal history of colonic polyps: Secondary | ICD-10-CM | POA: Diagnosis not present

## 2019-11-06 LAB — CBC WITH DIFFERENTIAL/PLATELET
Absolute Monocytes: 636 cells/uL (ref 200–950)
Basophils Absolute: 74 cells/uL (ref 0–200)
Basophils Relative: 0.7 %
Eosinophils Absolute: 170 cells/uL (ref 15–500)
Eosinophils Relative: 1.6 %
HCT: 43.9 % (ref 38.5–50.0)
Hemoglobin: 14.2 g/dL (ref 13.2–17.1)
Lymphs Abs: 4664 cells/uL — ABNORMAL HIGH (ref 850–3900)
MCH: 30.5 pg (ref 27.0–33.0)
MCHC: 32.3 g/dL (ref 32.0–36.0)
MCV: 94.2 fL (ref 80.0–100.0)
MPV: 11.6 fL (ref 7.5–12.5)
Monocytes Relative: 6 %
Neutro Abs: 5056 cells/uL (ref 1500–7800)
Neutrophils Relative %: 47.7 %
Platelets: 256 10*3/uL (ref 140–400)
RBC: 4.66 10*6/uL (ref 4.20–5.80)
RDW: 12 % (ref 11.0–15.0)
Total Lymphocyte: 44 %
WBC: 10.6 10*3/uL (ref 3.8–10.8)

## 2019-11-06 LAB — COMPREHENSIVE METABOLIC PANEL
AG Ratio: 1.2 (calc) (ref 1.0–2.5)
ALT: 12 U/L (ref 9–46)
AST: 11 U/L (ref 10–35)
Albumin: 3.1 g/dL — ABNORMAL LOW (ref 3.6–5.1)
Alkaline phosphatase (APISO): 69 U/L (ref 35–144)
BUN: 11 mg/dL (ref 7–25)
CO2: 29 mmol/L (ref 20–32)
Calcium: 9 mg/dL (ref 8.6–10.3)
Chloride: 103 mmol/L (ref 98–110)
Creat: 0.84 mg/dL (ref 0.70–1.25)
Globulin: 2.6 g/dL (calc) (ref 1.9–3.7)
Glucose, Bld: 279 mg/dL — ABNORMAL HIGH (ref 65–99)
Potassium: 4.5 mmol/L (ref 3.5–5.3)
Sodium: 138 mmol/L (ref 135–146)
Total Bilirubin: 0.4 mg/dL (ref 0.2–1.2)
Total Protein: 5.7 g/dL — ABNORMAL LOW (ref 6.1–8.1)

## 2019-11-06 LAB — HEPATITIS C RNA QUANTITATIVE
HCV Quantitative Log: <1.18 Log IU/mL
HCV RNA, PCR, QN: <15 IU/mL

## 2019-11-28 ENCOUNTER — Other Ambulatory Visit: Payer: Self-pay | Admitting: Emergency Medicine

## 2019-11-28 DIAGNOSIS — B182 Chronic viral hepatitis C: Secondary | ICD-10-CM

## 2019-12-25 ENCOUNTER — Ambulatory Visit: Payer: Medicare Other | Admitting: Nurse Practitioner

## 2019-12-25 ENCOUNTER — Encounter: Payer: Self-pay | Admitting: Internal Medicine

## 2019-12-25 NOTE — Progress Notes (Deleted)
Referring Provider: Curlene Labrum, MD Primary Care Physician:  Curlene Labrum, MD Primary GI:  Dr. Gala Romney  No chief complaint on file.   HPI:   Ryan King is a 64 y.o. male who presents   Past Medical History:  Diagnosis Date  . Arthritis   . Essential hypertension   . Irregular heart rate    saw Dr. Rozann Lesches 10/2016; PACs, brief PAT on Holter  . Type 2 diabetes mellitus with diabetic neuropathy ([place])    type 2    Past Surgical History:  Procedure Laterality Date  . ANTERIOR CERVICAL DECOMP/DISCECTOMY FUSION N/A 01/20/2017   Procedure: Anterior Cervical Discectomy Fusion - Cervical three-Cervical four - Cervical four-Cervical five, possible Cervical four Corpectomy;  Surgeon: Consuella Lose, MD;  Location: [facility];  Service: Neurosurgery;  Laterality: N/A;  . Arm Surgery Right 06/2016  . CATARACT EXTRACTION W/PHACO Left 05/01/2017   Procedure: CATARACT EXTRACTION PHACO AND INTRAOCULAR LENS PLACEMENT (IOC);  Surgeon: Tonny Branch, MD;  Location: [facility];  Service: Ophthalmology;  Laterality: Left;  CDE: 8.47  . COLONOSCOPY N/A 10/18/2016   Procedure: COLONOSCOPY;  Surgeon: Daneil Dolin, MD;  Location: [facility];  Service: Endoscopy;  Laterality: N/A;  10:00 AM  . EYE SURGERY     right eye cataract  . FRACTURE SURGERY Right 06/2016    Current Outpatient Medications  Medication Sig Dispense Refill  . CLENPIQ 10-3.5-12 MG-GM -GM/160ML SOLN Take 320 mLs by mouth once.    . fluticasone (FLONASE) 50 MCG/ACT nasal spray Place 2 sprays into both nostrils daily as needed for allergies.    Marland Kitchen glipiZIDE (GLUCOTROL XL) 10 MG 24 hr tablet Take 10 mg by mouth in the morning and at bedtime.    Marland Kitchen lisinopril (PRINIVIL,ZESTRIL) 5 MG tablet Take 5 mg by mouth every evening.     . loratadine (CLARITIN) 10 MG tablet Take 10 mg by mouth daily as needed for allergies.    . metFORMIN (GLUCOPHAGE) 1000 MG tablet Take 1,000 mg by mouth 2 (two) times daily with a meal.    .  Multiple Vitamin (MULTIVITAMIN WITH MINERALS) TABS tablet Take 1 tablet by mouth daily.    Vladimir Faster Glycol-Propyl Glycol (LUBRICANT EYE DROPS) 0.4-0.3 % SOLN Place 1 drop into both eyes 3 (three) times daily as needed (dry/irritated eyes.).     No current facility-administered medications for this visit.    Allergies as of 12/25/2019  . (No Known Allergies)    Family History  Problem Relation Age of Onset  . Diabetes Mother   . Hypertension Mother   . Lung cancer Brother   . Colon cancer Brother        he thinks so, but not sure  . Multiple sclerosis Brother   . Stroke Brother   . Diabetes Mellitus II Brother     Social History   Socioeconomic History  . Marital status: Single    Spouse name: Not on file  . Number of children: Not on file  . Years of education: Not on file  . Highest education level: Not on file  Occupational History  . Not on file  Tobacco Use  . Smoking status: Current Every Day Smoker    Packs/day: 1.00    Years: 45.00    Pack years: 45.00    Types: Cigarettes  . Smokeless tobacco: Never Used  . Tobacco comment: some days does not smoke  Vaping Use  . Vaping Use: Never used  Substance  and Sexual Activity  . Alcohol use: Yes    Comment: As of 10/13//20: last drink in 12/19/2018; previously: Rarely-Once month  . Drug use: Yes    Types: "Crack" cocaine    Comment: As of 10/13//2020: none in 2+ months. Previously: As of 01/03/19 "not that much" "maybe months apart" last use a few days ago  . Sexual activity: Not on file  Other Topics Concern  . Not on file  Social History Narrative  . Not on file   Social Determinants of Health   Financial Resource Strain:   . Difficulty of Paying Living Expenses:   Food Insecurity:   . Worried About Charity fundraiser in the Last Year:   . Arboriculturist in the Last Year:   Transportation Needs:   . Film/video editor (Medical):   Marland Kitchen Lack of Transportation (Non-Medical):   Physical Activity:   .  Days of Exercise per Week:   . Minutes of Exercise per Session:   Stress:   . Feeling of Stress :   Social Connections:   . Frequency of Communication with Friends and Family:   . Frequency of Social Gatherings with Friends and Family:   . Attends Religious Services:   . Active Member of Clubs or Organizations:   . Attends Archivist Meetings:   Marland Kitchen Marital Status:     Subjective: Review of Systems  Constitutional: Negative for chills, fever, malaise/fatigue and weight loss.  HENT: Negative for congestion and sore throat.   Respiratory: Negative for cough and shortness of breath.   Cardiovascular: Negative for chest pain and palpitations.  Gastrointestinal: Negative for abdominal pain, blood in stool, diarrhea, melena, nausea and vomiting.  Musculoskeletal: Negative for joint pain and myalgias.  Skin: Negative for rash.  Neurological: Negative for dizziness and weakness.  Endo/Heme/Allergies: Does not bruise/bleed easily.  Psychiatric/Behavioral: Negative for depression. The patient is not nervous/anxious.   All other systems reviewed and are negative.    Objective: There were no vitals taken for this visit. Physical Exam Vitals and nursing note reviewed.  Constitutional:      General: He is not in acute distress.    Appearance: Normal appearance. He is not ill-appearing, toxic-appearing or diaphoretic.  HENT:     Head: Normocephalic and atraumatic.     Nose: No congestion or rhinorrhea.  Eyes:     General: No scleral icterus. Cardiovascular:     Rate and Rhythm: Normal rate and regular rhythm.     Heart sounds: Normal heart sounds.  Pulmonary:     Effort: Pulmonary effort is normal.     Breath sounds: Normal breath sounds.  Abdominal:     General: Bowel sounds are normal. There is no distension.     Palpations: Abdomen is soft. There is no hepatomegaly, splenomegaly or mass.     Tenderness: There is no abdominal tenderness. There is no guarding or rebound.       Hernia: No hernia is present.  Musculoskeletal:     Cervical back: Neck supple.  Skin:    General: Skin is warm and dry.     Coloration: Skin is not jaundiced.     Findings: No bruising or rash.  Neurological:     General: No focal deficit present.     Mental Status: He is alert and oriented to person, place, and time. Mental status is at baseline.  Psychiatric:        Mood and Affect: Mood normal.  Behavior: Behavior normal.        Thought Content: Thought content normal.       12/25/2019 2:17 PM   Disclaimer: This note was dictated with voice recognition software. Similar sounding words can inadvertently be transcribed and may not be corrected upon review.

## 2019-12-26 NOTE — Patient Instructions (Signed)
Ryan King  12/26/2019     @PREFPERIOPPHARMACY @   Your procedure is scheduled on  12/31/2019 .  Report to [facility] at  1215  P.M.  Call this number if you have problems the morning of surgery:  [phone]   Remember:  Follow the diet and prep instructions given to you by Dr Roseanne Kaufman office.                       Take these medicines the morning of surgery with A SIP OF WATER  None    Do not wear jewelry, make-up or nail polish.  Do not wear lotions, powders, or perfumes. Please wear deodorant and brush your teeth.  Do not shave 48 hours prior to surgery.  Men may shave face and neck.  Do not bring valuables to the hospital.  [facility] is not responsible for any belongings or valuables.  Contacts, dentures or bridgework may not be worn into surgery.  Leave your suitcase in the car.  After surgery it may be brought to your room.  For patients admitted to the hospital, discharge time will be determined by your treatment team.  Patients discharged the day of surgery will not be allowed to drive home.   Name and phone number of your driver:   family Special instructions:  DO NOT smoke the morning of your procedure.  Please read over the following fact sheets that you were given. Anesthesia Post-op Instructions and Care and Recovery After Surgery       Colonoscopy, Adult, Care After This sheet gives you information about how to care for yourself after your procedure. Your health care provider may also give you more specific instructions. If you have problems or questions, contact your health care provider. What can I expect after the procedure? After the procedure, it is common to have:  A small amount of blood in your stool for 24 hours after the procedure.  Some gas.  Mild cramping or bloating of your abdomen. Follow these instructions at home: Eating and drinking   Drink enough fluid to keep your urine pale yellow.  Follow instructions from your  health care provider about eating or drinking restrictions.  Resume your normal diet as instructed by your health care provider. Avoid heavy or fried foods that are hard to digest. Activity  Rest as told by your health care provider.  Avoid sitting for a long time without moving. Get up to take short walks every 1-2 hours. This is important to improve blood flow and breathing. Ask for help if you feel weak or unsteady.  Return to your normal activities as told by your health care provider. Ask your health care provider what activities are safe for you. Managing cramping and bloating   Try walking around when you have cramps or feel bloated.  Apply heat to your abdomen as told by your health care provider. Use the heat source that your health care provider recommends, such as a moist heat pack or a heating pad. ? Place a towel between your skin and the heat source. ? Leave the heat on for 20-30 minutes. ? Remove the heat if your skin turns bright red. This is especially important if you are unable to feel pain, heat, or cold. You may have a greater risk of getting burned. General instructions  For the first 24 hours after the procedure: ? Do not drive or use machinery. ? Do not  sign important documents. ? Do not drink alcohol. ? Do your regular daily activities at a slower pace than normal. ? Eat soft foods that are easy to digest.  Take over-the-counter and prescription medicines only as told by your health care provider.  Keep all follow-up visits as told by your health care provider. This is important. Contact a health care provider if:  You have blood in your stool 2-3 days after the procedure. Get help right away if you have:  More than a small spotting of blood in your stool.  Large blood clots in your stool.  Swelling of your abdomen.  Nausea or vomiting.  A fever.  Increasing pain in your abdomen that is not relieved with medicine. Summary  After the procedure,  it is common to have a small amount of blood in your stool. You may also have mild cramping and bloating of your abdomen.  For the first 24 hours after the procedure, do not drive or use machinery, sign important documents, or drink alcohol.  Get help right away if you have a lot of blood in your stool, nausea or vomiting, a fever, or increased pain in your abdomen. This information is not intended to replace advice given to you by your health care provider. Make sure you discuss any questions you have with your health care provider. Document Revised: 12/31/2018 Document Reviewed: 12/31/2018 Elsevier Patient Education  [place] After These instructions provide you with information about caring for yourself after your procedure. Your health care provider may also give you more specific instructions. Your treatment has been planned according to current medical practices, but problems sometimes occur. Call your health care provider if you have any problems or questions after your procedure. What can I expect after the procedure? After your procedure, you may:  Feel sleepy for several hours.  Feel clumsy and have poor balance for several hours.  Feel forgetful about what happened after the procedure.  Have poor judgment for several hours.  Feel nauseous or vomit.  Have a sore throat if you had a breathing tube during the procedure. Follow these instructions at home: For at least 24 hours after the procedure:      Have a responsible adult stay with you. It is important to have someone help care for you until you are awake and alert.  Rest as needed.  Do not: ? Participate in activities in which you could fall or become injured. ? Drive. ? Use heavy machinery. ? Drink alcohol. ? Take sleeping pills or medicines that cause drowsiness. ? Make important decisions or sign legal documents. ? Take care of children on your own. Eating and  drinking  Follow the diet that is recommended by your health care provider.  If you vomit, drink water, juice, or soup when you can drink without vomiting.  Make sure you have little or no nausea before eating solid foods. General instructions  Take over-the-counter and prescription medicines only as told by your health care provider.  If you have sleep apnea, surgery and certain medicines can increase your risk for breathing problems. Follow instructions from your health care provider about wearing your sleep device: ? Anytime you are sleeping, including during daytime naps. ? While taking prescription pain medicines, sleeping medicines, or medicines that make you drowsy.  If you smoke, do not smoke without supervision.  Keep all follow-up visits as told by your health care provider. This is important. Contact a health care provider  if:  You keep feeling nauseous or you keep vomiting.  You feel light-headed.  You develop a rash.  You have a fever. Get help right away if:  You have trouble breathing. Summary  For several hours after your procedure, you may feel sleepy and have poor judgment.  Have a responsible adult stay with you for at least 24 hours or until you are awake and alert. This information is not intended to replace advice given to you by your health care provider. Make sure you discuss any questions you have with your health care provider. Document Revised: 09/04/2017 Document Reviewed: 09/27/2015 Elsevier Patient Education  [place].

## 2019-12-30 ENCOUNTER — Telehealth: Payer: Self-pay | Admitting: Internal Medicine

## 2019-12-30 ENCOUNTER — Encounter: Payer: Self-pay | Admitting: *Deleted

## 2019-12-30 ENCOUNTER — Encounter (HOSPITAL_COMMUNITY)
Admission: RE | Admit: 2019-12-30 | Discharge: 2019-12-30 | Disposition: A | Discharge status: Home / Self Care | Payer: Medicare Other | Source: Ambulatory Visit | Attending: Internal Medicine | Admitting: Internal Medicine

## 2019-12-30 ENCOUNTER — Other Ambulatory Visit (HOSPITAL_COMMUNITY): Payer: Medicare Other

## 2019-12-30 ENCOUNTER — Other Ambulatory Visit: Payer: Self-pay

## 2019-12-30 NOTE — Telephone Encounter (Signed)
Called pt. He requested afternoon appt. Patient scheduled for 8/9 at 2:45pm. Patient aware will need pre-op/covid test appt. Confirmed mailing address. Advised will mail miralax prep instruction to him with pre-op/covid test appt. Called endo and made aware of appt change

## 2019-12-30 NOTE — Telephone Encounter (Signed)
Pt has court and needs to reschedule procedure. [phone]

## 2020-01-03 ENCOUNTER — Encounter: Payer: Self-pay | Admitting: *Deleted

## 2020-01-03 ENCOUNTER — Telehealth: Payer: Self-pay | Admitting: *Deleted

## 2020-01-03 IMAGING — US US ABDOMEN COMPLETE WITH ELASTOGRAPHY
1 series · 12 of 25 positions shown · non-contrast
Comparison: None.

CLINICAL DATA: Chronic hepatitis-C.  Diabetes.  Hypertension.



[Series 1: us abdomen complete with elastography · 0.18mm/px · 12 of 188 slices shown]
[im 8/188]
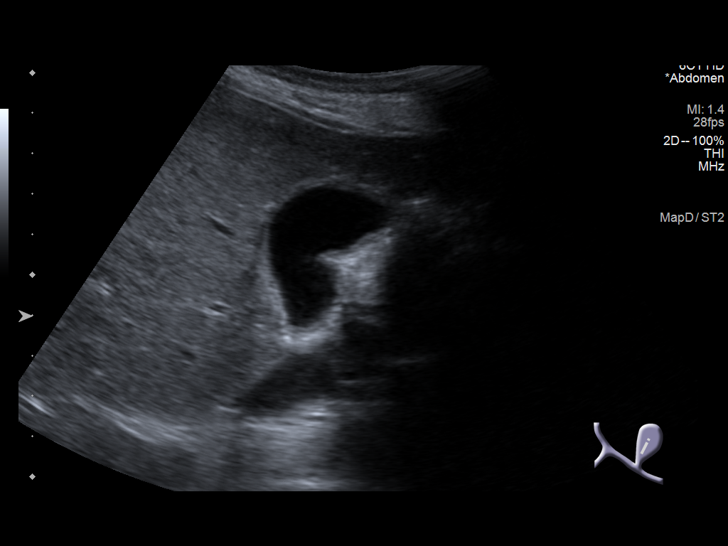
[im 24/188]
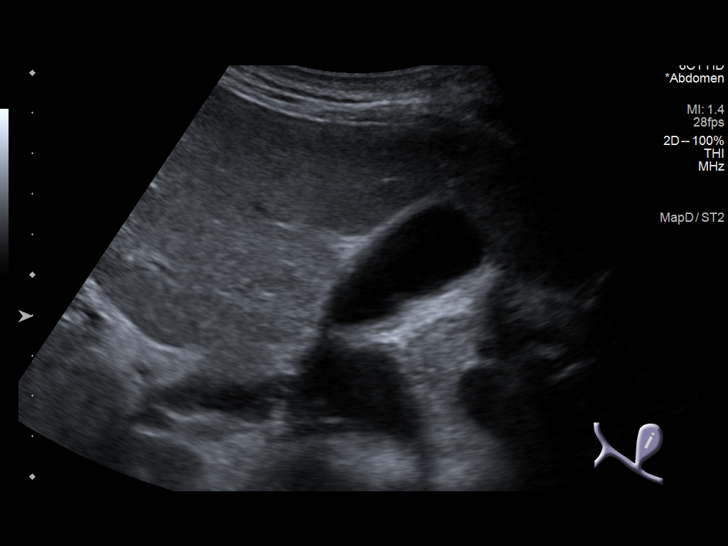
[im 39/188]
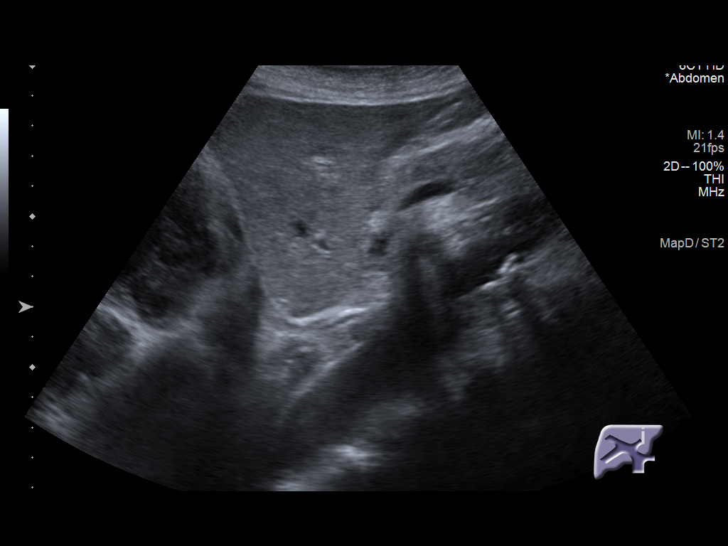
[im 55/188]
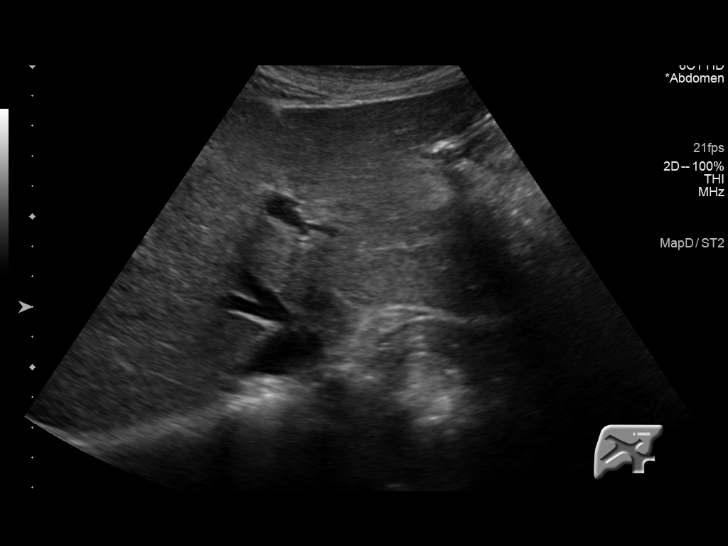
[im 71/188]
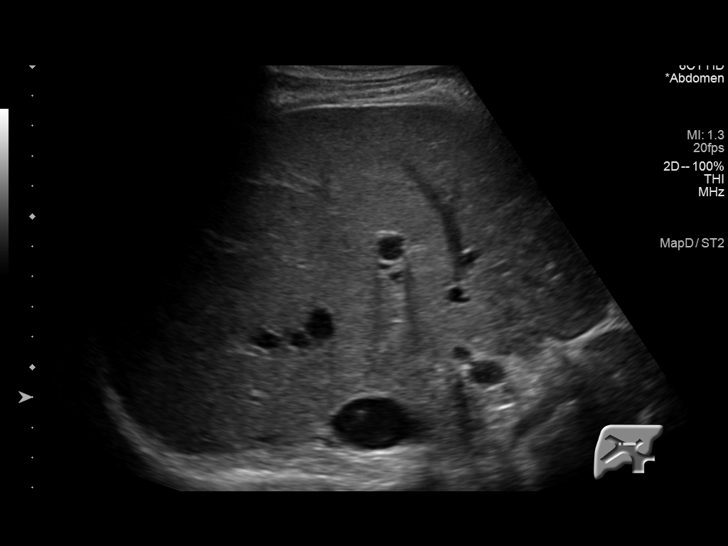
[im 86/188]
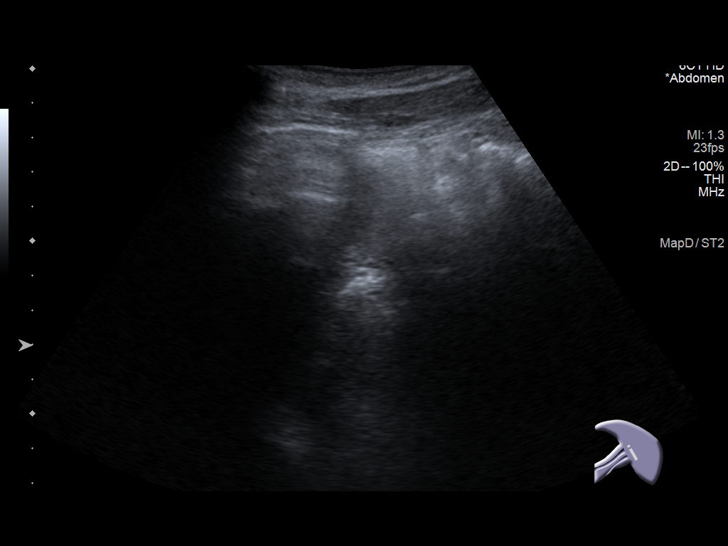
[im 102/188]
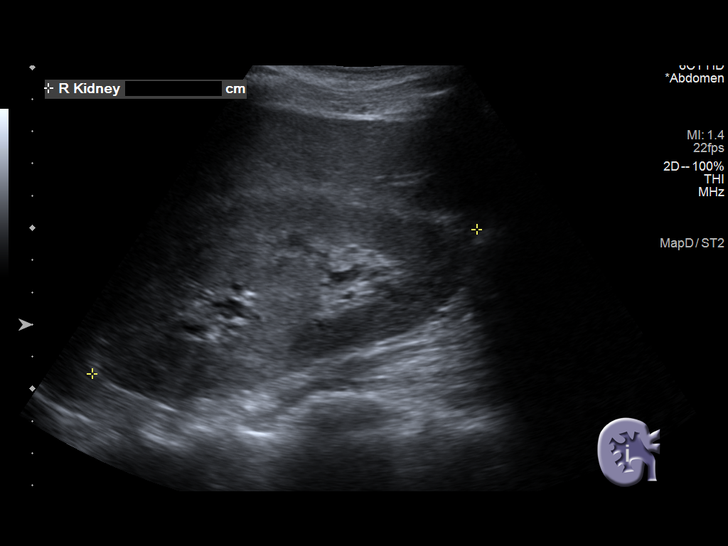
[im 117/188]
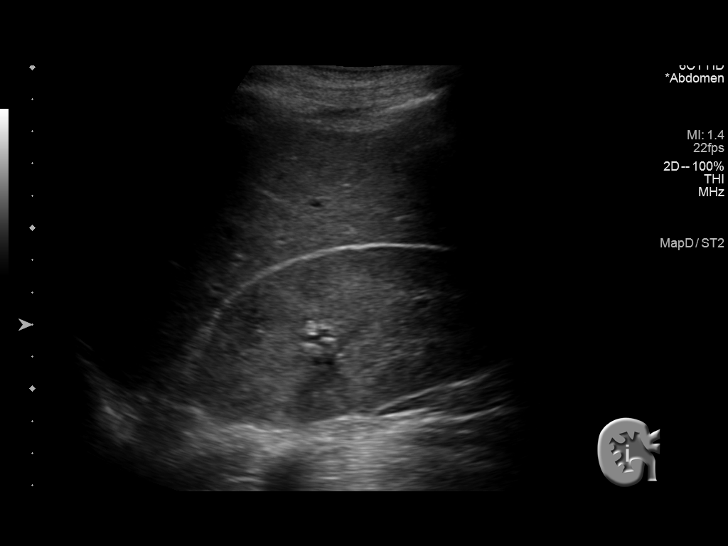
[im 133/188]
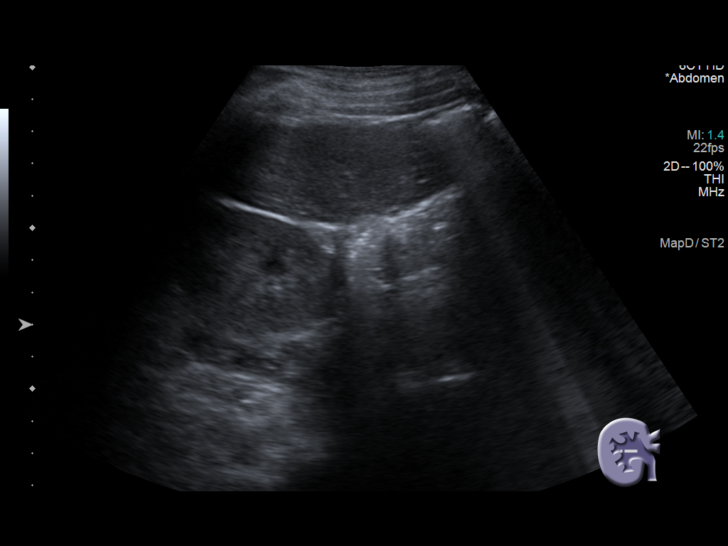
[im 149/188]
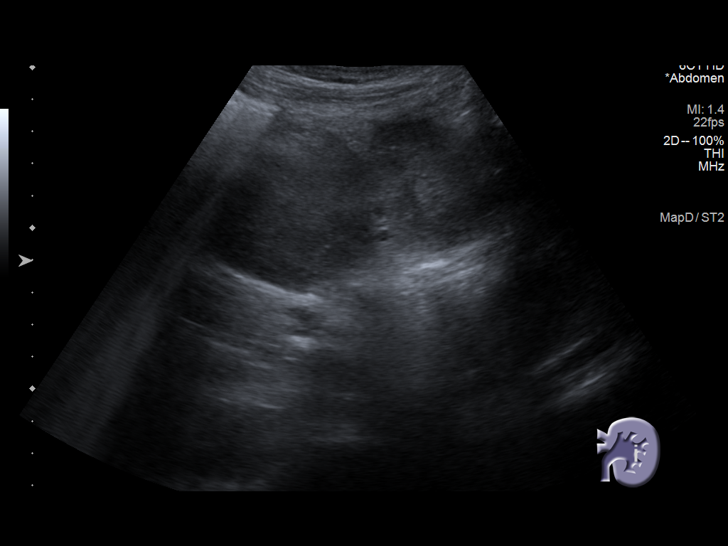
[im 164/188]
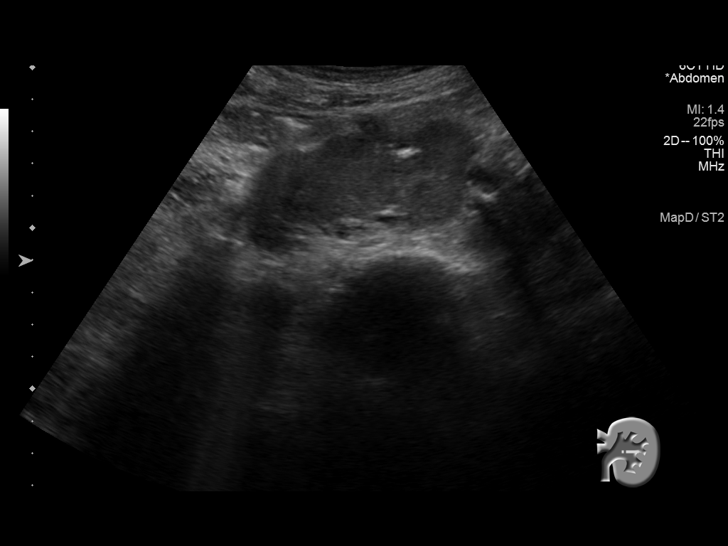
[im 180/188]
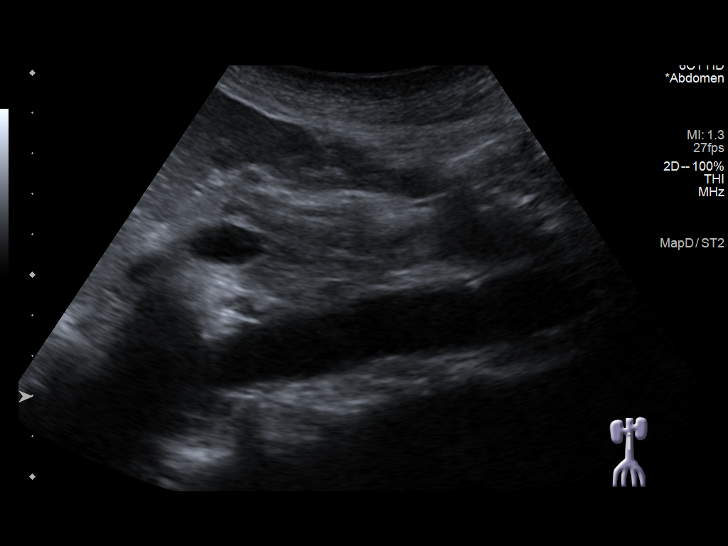

[12 of 25 positions shown; findings below may reference images not displayed]

FINDINGS: ULTRASOUND ABDOMEN

Gallbladder: No gallstones. No significant gallbladder wall
thickening. A single 0.3 cm gallbladder polyp is present. No
sonographic Murphy sign noted by sonographer.

Common bile duct: Diameter: 0.4 cm

Liver: No focal lesion identified. Within normal limits in
parenchymal echogenicity. Portal vein is patent on color Doppler
imaging with normal direction of blood flow towards the liver.

IVC: No abnormality visualized.

Pancreas: Visualized portion unremarkable.

Spleen: Size and appearance within normal limits.

Right Kidney: Length: 12.8 cm. Mild reduction in corticomedullary
differentiation. No mass or hydronephrosis visualized.

Left Kidney: Length: 11.3 cm. Pelvic location. Mild reduction in
corticomedullary differentiation. No mass or hydronephrosis
visualized.

Abdominal aorta: Ectatic abdominal aorta at 2.7 cm.

Other findings: None.

ULTRASOUND HEPATIC ELASTOGRAPHY

Device: Siemens Helix VTQ

Patient position: Left lateral decubitus

Transducer 6C1

Number of measurements: 10

Hepatic segment:  8

Median velocity:   1.55 m/sec

IQR:

IQR/Median velocity ratio:

Corresponding Metavir fibrosis score:  F2 + some F3

Risk of fibrosis: Moderate

Limitations of exam: None

Please note that abnormal shear wave velocities may also be
identified in clinical settings other than with hepatic fibrosis,
such as: acute hepatitis, elevated right heart and central venous
pressures including use of beta blockers, Simas disease
([place]), infiltrative processes such as
mastocytosis/amyloidosis/infiltrative tumor, extrahepatic
cholestasis, in the post-prandial state, and liver transplantation.
Correlation with patient history, laboratory data, and clinical
condition recommended.
IMPRESSION: ULTRASOUND ABDOMEN:

1. Mild reduction in corticomedullary differentiation in the
kidneys, possibly from diabetic nephropathy.
2. A single 0.3 cm gallbladder polyp is present.
3. Ectatic abdominal aorta at risk for aneurysm development.
Recommend followup by ultrasound in 5 years. This recommendation
follows ACR consensus guidelines: White Paper of the ACR Incidental
Findings Committee II on Vascular Findings. [HOSPITAL] 0069;
[DATE].

ULTRASOUND HEPATIC ELASTOGRAPHY:

Median hepatic shear wave velocity is calculated at 1.55 m/sec.

Corresponding Metavir fibrosis score is F2 + some F3.

Risk of fibrosis is moderate.

Follow-up: Additional testing appropriate

## 2020-01-03 NOTE — Telephone Encounter (Signed)
Pt called in and said that he received prep instructions for Miralax.  He wanted to know if he could use the prep kit that he still had (Clenpiq).  Informed pt that we were unaware that he had former prep kit and it was ok to use that one.  Pt is aware that we will mail him out new prep instructions for Clenpiq.  Pt voiced understanding.

## 2020-01-23 ENCOUNTER — Telehealth: Payer: Self-pay | Admitting: *Deleted

## 2020-01-23 ENCOUNTER — Other Ambulatory Visit (HOSPITAL_COMMUNITY): Payer: Medicare Other | Attending: Internal Medicine

## 2020-01-23 ENCOUNTER — Encounter (HOSPITAL_COMMUNITY)
Admission: RE | Admit: 2020-01-23 | Discharge: 2020-01-23 | Disposition: A | Discharge status: Home / Self Care | Payer: Medicare Other | Source: Ambulatory Visit | Attending: Internal Medicine | Admitting: Internal Medicine

## 2020-01-23 NOTE — Telephone Encounter (Signed)
Called pt and LMOVM at home #, called mobile # and received message he is not able to receive calls at this time Patient on schedule for 8/9

## 2020-01-23 NOTE — Telephone Encounter (Signed)
-----   Message from Wilmer Floor, RN sent at 01/23/2020  3:16 PM EDT ----- Regarding: no show for Covid test unable to reach by phone Ryan King,  [name] was a no show for Covid test today and I am unable to reach him by phone  Thanks,  Rosalyn Gess

## 2020-01-24 NOTE — Telephone Encounter (Signed)
Tried to call patient and LMOVM at home# and mobile # is not receving messages. Melanie in endo also has been trying to call pt. Patient procedure has been cancelled. Patient has also previously cancelled prior and will need OV to r/s.   FYI to EG

## 2020-01-27 ENCOUNTER — Ambulatory Visit (HOSPITAL_COMMUNITY): Admission: RE | Admit: 2020-01-27 | Payer: Medicare Other | Source: Home / Self Care | Admitting: Internal Medicine

## 2020-01-27 ENCOUNTER — Encounter (HOSPITAL_COMMUNITY): Admission: RE | Payer: Self-pay | Source: Home / Self Care

## 2020-01-27 SURGERY — COLONOSCOPY WITH PROPOFOL
Anesthesia: Monitor Anesthesia Care

## 2020-01-27 NOTE — Telephone Encounter (Signed)
Noted  

## 2020-01-29 ENCOUNTER — Other Ambulatory Visit: Payer: Self-pay

## 2020-01-29 DIAGNOSIS — B182 Chronic viral hepatitis C: Secondary | ICD-10-CM

## 2020-01-30 ENCOUNTER — Encounter: Payer: Self-pay | Admitting: Internal Medicine

## 2020-01-30 ENCOUNTER — Telehealth: Payer: Self-pay | Admitting: Internal Medicine

## 2020-01-30 NOTE — Telephone Encounter (Signed)
Pt said he missed his procedure, he said he has a lot happening right now. His phone is turned off and asked if he could reschedule his procedure and for [place] to mail him the date, time and instructions. I verified address and it's correct per patient. He called from [phone]

## 2020-01-30 NOTE — Telephone Encounter (Signed)
Patient needs OV to reschedule. Unable to reach patient. Please schedule OV and mail letter. thanks

## 2020-01-30 NOTE — Telephone Encounter (Signed)
OV made and letter mailed °

## 2020-01-31 DIAGNOSIS — M549 Dorsalgia, unspecified: Secondary | ICD-10-CM | POA: Diagnosis not present

## 2020-01-31 DIAGNOSIS — I10 Essential (primary) hypertension: Secondary | ICD-10-CM | POA: Diagnosis not present

## 2020-01-31 DIAGNOSIS — Z79899 Other long term (current) drug therapy: Secondary | ICD-10-CM | POA: Diagnosis not present

## 2020-01-31 DIAGNOSIS — M542 Cervicalgia: Secondary | ICD-10-CM | POA: Diagnosis not present

## 2020-01-31 DIAGNOSIS — M792 Neuralgia and neuritis, unspecified: Secondary | ICD-10-CM | POA: Diagnosis not present

## 2020-01-31 DIAGNOSIS — Z0000 Encounter for general adult medical examination without abnormal findings: Secondary | ICD-10-CM | POA: Diagnosis not present

## 2020-02-11 DIAGNOSIS — E782 Mixed hyperlipidemia: Secondary | ICD-10-CM | POA: Diagnosis not present

## 2020-02-11 DIAGNOSIS — E1122 Type 2 diabetes mellitus with diabetic chronic kidney disease: Secondary | ICD-10-CM | POA: Diagnosis not present

## 2020-02-11 DIAGNOSIS — Z13220 Encounter for screening for lipoid disorders: Secondary | ICD-10-CM | POA: Diagnosis not present

## 2020-02-13 DIAGNOSIS — J439 Emphysema, unspecified: Secondary | ICD-10-CM | POA: Diagnosis not present

## 2020-02-13 DIAGNOSIS — I700 Atherosclerosis of aorta: Secondary | ICD-10-CM | POA: Diagnosis not present

## 2020-02-13 DIAGNOSIS — E782 Mixed hyperlipidemia: Secondary | ICD-10-CM | POA: Diagnosis not present

## 2020-02-13 DIAGNOSIS — E1140 Type 2 diabetes mellitus with diabetic neuropathy, unspecified: Secondary | ICD-10-CM | POA: Diagnosis not present

## 2020-02-17 DIAGNOSIS — Z79899 Other long term (current) drug therapy: Secondary | ICD-10-CM | POA: Diagnosis not present

## 2020-02-17 DIAGNOSIS — I10 Essential (primary) hypertension: Secondary | ICD-10-CM | POA: Diagnosis not present

## 2020-02-17 DIAGNOSIS — E119 Type 2 diabetes mellitus without complications: Secondary | ICD-10-CM | POA: Diagnosis not present

## 2020-02-28 DIAGNOSIS — G8929 Other chronic pain: Secondary | ICD-10-CM | POA: Diagnosis not present

## 2020-02-28 DIAGNOSIS — G959 Disease of spinal cord, unspecified: Secondary | ICD-10-CM | POA: Diagnosis not present

## 2020-02-28 DIAGNOSIS — M542 Cervicalgia: Secondary | ICD-10-CM | POA: Diagnosis not present

## 2020-02-28 DIAGNOSIS — Z79899 Other long term (current) drug therapy: Secondary | ICD-10-CM | POA: Diagnosis not present

## 2020-03-01 LAB — HEPATITIS C RNA QUANTITATIVE
HCV RNA, PCR, QN (Log): <1.18 log IU/mL
HCV RNA, PCR, QN: <15 IU/mL

## 2020-03-19 ENCOUNTER — Telehealth: Payer: Self-pay | Admitting: *Deleted

## 2020-03-19 ENCOUNTER — Other Ambulatory Visit: Payer: Self-pay

## 2020-03-19 ENCOUNTER — Encounter: Payer: Self-pay | Admitting: Internal Medicine

## 2020-03-19 ENCOUNTER — Encounter: Payer: Self-pay | Admitting: Nurse Practitioner

## 2020-03-19 ENCOUNTER — Ambulatory Visit (INDEPENDENT_AMBULATORY_CARE_PROVIDER_SITE_OTHER): Payer: Medicare Other | Admitting: Nurse Practitioner

## 2020-03-19 VITALS — BP 148/79 | HR 69 | Temp 97.0°F | Ht 71.0 in | Wt 154.8 lb

## 2020-03-19 DIAGNOSIS — K7400 Hepatic fibrosis, unspecified: Secondary | ICD-10-CM

## 2020-03-19 DIAGNOSIS — B182 Chronic viral hepatitis C: Secondary | ICD-10-CM | POA: Diagnosis not present

## 2020-03-19 NOTE — Telephone Encounter (Signed)
Patient Ryan King scheduled 10/8, arrival 10:!5am, npo midnight.  Called pt and awre of appt details.

## 2020-03-19 NOTE — Patient Instructions (Signed)
Your health issues we discussed today were:   Previous, now cured hepatitis C: 1. As we discussed, your hepatitis C is cured.  This is great news! 2. As we discussed, you can be reinfected with hepatitis C.  Recommend you avoid high risk behaviors such as intranasal and intravenous drug use, unprotected sex, etc. 3. Because of your moderate scarring of your liver we will plan for an ultrasound your liver every 6 months for the next year. 4. After 1 year of being cured we can recheck for the degree of scarring and may be able to adjust how often we do your ultrasound 5. Call us for any worsening or significant symptoms  Need for colonoscopy: 1. We will schedule your colonoscopy for you 2. Further recommendations will follow your colonoscopy 3. Call us if you have any concerning symptoms such as obvious GI bleeding  Overall I recommend:  1. Continue your other current medications 2. Return for follow-up in 6 months 3. Call us for any questions or concerns.   ---------------------------------------------------------------  I am glad you have gotten your COVID-19 vaccination!  Even though you are fully vaccinated you should continue to follow CDC and state/local guidelines.  ---------------------------------------------------------------   At [facility] we value your feedback. You may receive a survey about your visit today. Please share your experience as we strive to create trusting relationships with our patients to provide genuine, compassionate, quality care.  We appreciate your understanding and patience as we review any laboratory studies, imaging, and other diagnostic tests that are ordered as we care for you. Our office policy is 5 business days for review of these results, and any emergent or urgent results are addressed in a timely manner for your best interest. If you do not hear from our office in 1 week, please contact us.   We also encourage the use of  MyChart, which contains your medical information for your review as well. If you are not enrolled in this feature, an access code is on this after visit summary for your convenience. Thank you for allowing [place] to be involved in your care.  It was great to see you today!  I hope you have a great Fall!!

## 2020-03-19 NOTE — Progress Notes (Signed)
Referring Provider: Curlene Labrum, MD Primary Care Physician:  Curlene Labrum, MD Primary GI:  Dr. Gala Romney  Chief Complaint  Patient presents with  . Hepatitis C    also needs to r/s procedure    HPI:   Ryan King is a 64 y.o. male who presents to reschedule procedure, recommended office visit prior to rescheduling.  The patient was last seen in our office 10/17/2019 for chronic hepatitis C and history of colon polyps.  Noted chronic hepatitis C possibly due to drug use but no longer using.  Colonoscopy up-to-date and due in 2021 due to high risk for CRC from family history of colon cancer in his brother.  Previous colonoscopy 2018 with no polyps.  At his last visit he noted he finished Epclusa for hepatitis C the day prior, took all his medication and did not miss any doses.  He knows it is time for colonoscopy.  No other overt GI complaints.  Recommended end of treatment labs, repeat labs at 3 months to document SVR, schedule colonoscopy, follow-up in 3 months.  Labs completed 11/04/2019 which found essentially normal CBC, CMP with elevated glucose and low albumin at 3.1 but otherwise normal LFTs, hepatitis C RNA not detected.  Final HCVRNA lab was completed on 02/28/2020 which was not detected indicating sustained viral response (SVR)/cure.  The patient was scheduled in July for his colonoscopy but call to reschedule which was done and he was rescheduled for 01/27/2020.  However, he was a no-show to his Covid testing.  When we reached out to him he indicated there is a lot happening right now.  Recommended office visit prior to rescheduling.  Today he states doing okay overall. We discussed his HCV cure status. Reminded him he can become reinfected and cautioned him to avoid high-risk behaviors. States he needs to reschedule his colonoscopy. Denies abdominal pain, N/V, hematochezia, melena, fever, chills, unintentional weight loss. Denies URI or flu-like symptoms. Denies loss of  sense of taste or smell. Denies URI or flu-like symptoms. Denies loss of sense of taste or smell. The patient has not received COVID-19 vaccination(s). They are not interested in vaccine scheduling information. Denies chest pain, dyspnea, dizziness, lightheadedness, syncope, near syncope. Denies any other upper or lower GI symptoms.  Past Medical History:  Diagnosis Date  . Arthritis   . Essential hypertension   . Irregular heart rate    saw Dr. Rozann Lesches 10/2016; PACs, brief PAT on Holter  . Type 2 diabetes mellitus with diabetic neuropathy ([place])    type 2    Past Surgical History:  Procedure Laterality Date  . ANTERIOR CERVICAL DECOMP/DISCECTOMY FUSION N/A 01/20/2017   Procedure: Anterior Cervical Discectomy Fusion - Cervical three-Cervical four - Cervical four-Cervical five, possible Cervical four Corpectomy;  Surgeon: Consuella Lose, MD;  Location: [facility];  Service: Neurosurgery;  Laterality: N/A;  . Arm Surgery Right 06/2016  . CATARACT EXTRACTION W/PHACO Left 05/01/2017   Procedure: CATARACT EXTRACTION PHACO AND INTRAOCULAR LENS PLACEMENT (IOC);  Surgeon: Tonny Branch, MD;  Location: [facility];  Service: Ophthalmology;  Laterality: Left;  CDE: 8.47  . COLONOSCOPY N/A 10/18/2016   Procedure: COLONOSCOPY;  Surgeon: Daneil Dolin, MD;  [facility];  Service: Endoscopy;  Laterality: N/A;  10:00 AM  . EYE SURGERY     right eye cataract  . FRACTURE SURGERY Right 06/2016    Current Outpatient Medications  Medication Sig Dispense Refill  . atorvastatin (LIPITOR) 20 MG tablet Take 20 mg  by mouth once a week.    . fluticasone (FLONASE) 50 MCG/ACT nasal spray Place 2 sprays into both nostrils daily as needed for allergies.    Marland Kitchen glipiZIDE (GLUCOTROL XL) 10 MG 24 hr tablet Take 10 mg by mouth in the morning and at bedtime.    Marland Kitchen lisinopril (PRINIVIL,ZESTRIL) 5 MG tablet Take 5 mg by mouth every evening.     . loratadine (CLARITIN) 10 MG tablet Take 10 mg by mouth daily as needed  for allergies.    . meloxicam (MOBIC) 15 MG tablet Take 15 mg by mouth daily as needed.    . metFORMIN (GLUCOPHAGE) 1000 MG tablet Take 1,000 mg by mouth 2 (two) times daily with a meal.    . Multiple Vitamin (MULTIVITAMIN WITH MINERALS) TABS tablet Take 1 tablet by mouth daily.    Vladimir Faster Glycol-Propyl Glycol (LUBRICANT EYE DROPS) 0.4-0.3 % SOLN Place 1 drop into both eyes 3 (three) times daily as needed (dry/irritated eyes.).    Marland Kitchen CLENPIQ 10-3.5-12 MG-GM -GM/160ML SOLN Take 320 mLs by mouth once. (Patient not taking: Reported on 03/19/2020)     No current facility-administered medications for this visit.    Allergies as of 03/19/2020  . (No Known Allergies)    Family History  Problem Relation Age of Onset  . Diabetes Mother   . Hypertension Mother   . Lung cancer Brother   . Colon cancer Brother        he thinks so, but not sure  . Multiple sclerosis Brother   . Stroke Brother   . Diabetes Mellitus II Brother     Social History   Socioeconomic History  . Marital status: Single    Spouse name: Not on file  . Number of children: Not on file  . Years of education: Not on file  . Highest education level: Not on file  Occupational History  . Not on file  Tobacco Use  . Smoking status: Current Every Day Smoker    Packs/day: 1.00    Years: 45.00    Pack years: 45.00    Types: Cigarettes  . Smokeless tobacco: Never Used  . Tobacco comment: some days does not smoke  Vaping Use  . Vaping Use: Never used  Substance and Sexual Activity  . Alcohol use: Yes    Comment: As of 10/13//20: last drink in 12/19/2018; previously: Rarely-Once month  . Drug use: Yes    Types: "Crack" cocaine    Comment: As of 10/13//2020: none in 2+ months. Previously: As of 01/03/19 "not that much" "maybe months apart" last use a few days ago  . Sexual activity: Not on file  Other Topics Concern  . Not on file  Social History Narrative  . Not on file   Social Determinants of Health   Financial  Resource Strain:   . Difficulty of Paying Living Expenses: Not on file  Food Insecurity:   . Worried About Charity fundraiser in the Last Year: Not on file  . Ran Out of Food in the Last Year: Not on file  Transportation Needs:   . Lack of Transportation (Medical): Not on file  . Lack of Transportation (Non-Medical): Not on file  Physical Activity:   . Days of Exercise per Week: Not on file  . Minutes of Exercise per Session: Not on file  Stress:   . Feeling of Stress : Not on file  Social Connections:   . Frequency of Communication with Friends and Family: Not on  file  . Frequency of Social Gatherings with Friends and Family: Not on file  . Attends Religious Services: Not on file  . Active Member of Clubs or Organizations: Not on file  . Attends Archivist Meetings: Not on file  . Marital Status: Not on file    Subjective: Review of Systems  Constitutional: Negative for chills, fever, malaise/fatigue and weight loss.  HENT: Negative for congestion and sore throat.   Respiratory: Negative for cough and shortness of breath.   Cardiovascular: Negative for chest pain and palpitations.  Gastrointestinal: Negative for abdominal pain, blood in stool, diarrhea, melena, nausea and vomiting.  Musculoskeletal: Negative for joint pain and myalgias.  Skin: Negative for rash.  Neurological: Negative for dizziness and weakness.  Endo/Heme/Allergies: Does not bruise/bleed easily.  Psychiatric/Behavioral: Negative for depression. The patient is not nervous/anxious.   All other systems reviewed and are negative.    Objective: BP (!) 148/79   Pulse 69   Temp (!) 97 F (36.1 C) (Oral)   Ht _0  (1.803 m)   Wt 154 lb 12.8 oz (70.2 kg)   BMI 21.59 kg/m  Physical Exam Vitals and nursing note reviewed.  Constitutional:      General: He is not in acute distress.    Appearance: Normal appearance. He is normal weight. He is not ill-appearing, toxic-appearing or diaphoretic.    HENT:     Head: Normocephalic and atraumatic.     Nose: No congestion or rhinorrhea.  Eyes:     General: No scleral icterus. Cardiovascular:     Rate and Rhythm: Normal rate and regular rhythm.     Heart sounds: Normal heart sounds.  Pulmonary:     Effort: Pulmonary effort is normal.     Breath sounds: Normal breath sounds.  Abdominal:     General: Bowel sounds are normal. There is no distension.     Palpations: Abdomen is soft. There is no hepatomegaly, splenomegaly or mass.     Tenderness: There is no abdominal tenderness. There is no guarding or rebound.     Hernia: No hernia is present.  Musculoskeletal:     Cervical back: Neck supple.  Skin:    General: Skin is warm and dry.     Coloration: Skin is not jaundiced.     Findings: No bruising or rash.  Neurological:     General: No focal deficit present.     Mental Status: He is alert and oriented to person, place, and time. Mental status is at baseline.  Psychiatric:        Mood and Affect: Mood normal.        Behavior: Behavior normal.        Thought Content: Thought content normal.      Assessment:  Pleasant 64 year old male with previous chronic hepatitis C status post treatment with Epclusa now documented SVR.  We discussed possibility for future infection.  He is also due for colonoscopy due to personal history of colon polyps and family history of colon cancer.  He was previously scheduled but had to cancel and recommended office visit prior to rescheduling.  Chronic hepatitis C: Hepatitis C RNA minimum 12 weeks after treatment completion was undetectable indicating SVR.  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  At 1 year we can recheck an elastography to see if he has had improvement in his scarring and if so we can likely back off to once  a year hepatoma screening.  Need for colonoscopy: Family history of colon cancer in his brother,  multiple colonoscopies with polypectomies in the patient.  Last colonoscopy 2018 with polypectomy and recommended 3-year repeat in 2021.  He is currently due.  He was recently scheduled but had to cancel due to lots of "other things going on".  He is not ready to reschedule.  We will proceed with scheduling at this time.  Proceed with colonoscopy by Dr. Gala Romney in near future: the risks, benefits, and alternatives have been discussed with the patient in detail. The patient states understanding and desires to proceed.  The patient is currently on Glucophage and Glucotrol.  History of drug abuse. The patient is not on any other anticoagulants, anxiolytics, chronic pain medications, antidepressants, antidiabetics, or iron supplements.  We will plan for the procedure on propofol/MAC to promote adequate sedation as is done for his last procedure.  ASA III (poorly controlled htn/DM [a1c 8.0], history drug use)   Plan: 1. Colonoscopy as described above 2. Right upper quadrant ultrasound 3. Follow-up in 6 months    Thank you for allowing [place] to participate in the care of Ardis I Linda Hedges, DNP, AGNP-C Adult & Gerontological Nurse Practitioner [facility]   03/19/2020 10:26 AM   Disclaimer: This note was dictated with voice recognition software. Similar sounding words can inadvertently be transcribed and may not be corrected upon review.

## 2020-03-27 ENCOUNTER — Ambulatory Visit (HOSPITAL_COMMUNITY): Admission: RE | Admit: 2020-03-27 | Payer: Medicare Other | Source: Ambulatory Visit

## 2020-04-09 ENCOUNTER — Telehealth: Payer: Self-pay | Admitting: *Deleted

## 2020-04-09 NOTE — Telephone Encounter (Signed)
Patient needs TCS with propofol, Dr. Gala Romney, asa 3 Called pt at both #'s and received message patient not currently receiving calls.

## 2020-04-13 NOTE — Telephone Encounter (Signed)
Called pt. Received message not receiving calls at this time. Letter mailed.

## 2020-05-01 DIAGNOSIS — F17210 Nicotine dependence, cigarettes, uncomplicated: Secondary | ICD-10-CM | POA: Diagnosis not present

## 2020-05-01 DIAGNOSIS — E1122 Type 2 diabetes mellitus with diabetic chronic kidney disease: Secondary | ICD-10-CM | POA: Diagnosis not present

## 2020-05-01 DIAGNOSIS — E782 Mixed hyperlipidemia: Secondary | ICD-10-CM | POA: Diagnosis not present

## 2020-05-04 DIAGNOSIS — E782 Mixed hyperlipidemia: Secondary | ICD-10-CM | POA: Diagnosis not present

## 2020-05-04 DIAGNOSIS — E1140 Type 2 diabetes mellitus with diabetic neuropathy, unspecified: Secondary | ICD-10-CM | POA: Diagnosis not present

## 2020-05-04 DIAGNOSIS — I700 Atherosclerosis of aorta: Secondary | ICD-10-CM | POA: Diagnosis not present

## 2020-05-04 DIAGNOSIS — J439 Emphysema, unspecified: Secondary | ICD-10-CM | POA: Diagnosis not present

## 2020-05-04 DIAGNOSIS — Z23 Encounter for immunization: Secondary | ICD-10-CM | POA: Diagnosis not present

## 2020-07-14 DIAGNOSIS — E119 Type 2 diabetes mellitus without complications: Secondary | ICD-10-CM | POA: Diagnosis not present

## 2020-07-14 DIAGNOSIS — K3533 Acute appendicitis with perforation and localized peritonitis, with abscess: Secondary | ICD-10-CM | POA: Diagnosis not present

## 2020-07-14 DIAGNOSIS — E860 Dehydration: Secondary | ICD-10-CM | POA: Diagnosis not present

## 2020-07-14 DIAGNOSIS — N170 Acute kidney failure with tubular necrosis: Secondary | ICD-10-CM | POA: Diagnosis not present

## 2020-07-14 DIAGNOSIS — E1140 Type 2 diabetes mellitus with diabetic neuropathy, unspecified: Secondary | ICD-10-CM | POA: Diagnosis not present

## 2020-07-14 DIAGNOSIS — K651 Peritoneal abscess: Secondary | ICD-10-CM | POA: Diagnosis not present

## 2020-07-14 DIAGNOSIS — Z7984 Long term (current) use of oral hypoglycemic drugs: Secondary | ICD-10-CM | POA: Diagnosis not present

## 2020-07-14 DIAGNOSIS — Z800 Family history of malignant neoplasm of digestive organs: Secondary | ICD-10-CM | POA: Diagnosis not present

## 2020-07-14 DIAGNOSIS — Z20822 Contact with and (suspected) exposure to covid-19: Secondary | ICD-10-CM | POA: Diagnosis not present

## 2020-07-14 DIAGNOSIS — K8689 Other specified diseases of pancreas: Secondary | ICD-10-CM | POA: Diagnosis not present

## 2020-07-14 DIAGNOSIS — E1165 Type 2 diabetes mellitus with hyperglycemia: Secondary | ICD-10-CM | POA: Diagnosis not present

## 2020-07-14 DIAGNOSIS — F17200 Nicotine dependence, unspecified, uncomplicated: Secondary | ICD-10-CM | POA: Diagnosis not present

## 2020-07-14 DIAGNOSIS — D72829 Elevated white blood cell count, unspecified: Secondary | ICD-10-CM | POA: Diagnosis not present

## 2020-07-31 DIAGNOSIS — E1122 Type 2 diabetes mellitus with diabetic chronic kidney disease: Secondary | ICD-10-CM | POA: Diagnosis not present

## 2020-07-31 DIAGNOSIS — R946 Abnormal results of thyroid function studies: Secondary | ICD-10-CM | POA: Diagnosis not present

## 2020-07-31 DIAGNOSIS — E1165 Type 2 diabetes mellitus with hyperglycemia: Secondary | ICD-10-CM | POA: Diagnosis not present

## 2020-07-31 DIAGNOSIS — E1140 Type 2 diabetes mellitus with diabetic neuropathy, unspecified: Secondary | ICD-10-CM | POA: Diagnosis not present

## 2020-08-06 DIAGNOSIS — E1140 Type 2 diabetes mellitus with diabetic neuropathy, unspecified: Secondary | ICD-10-CM | POA: Diagnosis not present

## 2020-08-06 DIAGNOSIS — E1122 Type 2 diabetes mellitus with diabetic chronic kidney disease: Secondary | ICD-10-CM | POA: Diagnosis not present

## 2020-08-06 DIAGNOSIS — K651 Peritoneal abscess: Secondary | ICD-10-CM | POA: Diagnosis not present

## 2020-08-06 DIAGNOSIS — E7849 Other hyperlipidemia: Secondary | ICD-10-CM | POA: Diagnosis not present

## 2020-08-06 DIAGNOSIS — F17210 Nicotine dependence, cigarettes, uncomplicated: Secondary | ICD-10-CM | POA: Diagnosis not present

## 2020-08-06 DIAGNOSIS — K3533 Acute appendicitis with perforation and localized peritonitis, with abscess: Secondary | ICD-10-CM | POA: Diagnosis not present

## 2020-08-07 ENCOUNTER — Telehealth: Payer: Self-pay | Admitting: Internal Medicine

## 2020-08-07 NOTE — Telephone Encounter (Signed)
Pt called to say he needed to set up his colonoscopy. [phone]

## 2020-08-07 NOTE — Telephone Encounter (Signed)
Patient has not been seen since 9/30 and will need OV to r/s. Called pt and he already has appt next month

## 2020-09-15 ENCOUNTER — Ambulatory Visit (INDEPENDENT_AMBULATORY_CARE_PROVIDER_SITE_OTHER): Payer: Medicare Other | Admitting: Nurse Practitioner

## 2020-09-15 ENCOUNTER — Other Ambulatory Visit: Payer: Self-pay

## 2020-09-15 ENCOUNTER — Telehealth: Payer: Self-pay

## 2020-09-15 ENCOUNTER — Encounter: Payer: Self-pay | Admitting: Nurse Practitioner

## 2020-09-15 VITALS — BP 161/84 | HR 86 | Temp 96.8°F | Ht 70.0 in | Wt 152.6 lb

## 2020-09-15 DIAGNOSIS — B182 Chronic viral hepatitis C: Secondary | ICD-10-CM | POA: Diagnosis not present

## 2020-09-15 DIAGNOSIS — Z86010 Personal history of colonic polyps: Secondary | ICD-10-CM

## 2020-09-15 DIAGNOSIS — Z800 Family history of malignant neoplasm of digestive organs: Secondary | ICD-10-CM

## 2020-09-15 NOTE — Patient Instructions (Signed)
Your health issues we discussed today were:   Previous hepatitis C: 1. Has been discussed, your hepatitis C is "cured" 2. However, as we also discussed you can become reinfected.  We recommend you avoid high-risk behavior such as drug use and unprotected sex. 3. Let us know if you have any questions about any of this  Need for colonoscopy: 1. We will schedule your colonoscopy for you 2. Further recommendations will follow your colonoscopy  Overall I recommend:  1. Continue other current medications 2. Return for follow-up based on recommendations made after your colonoscopy 3. Call us for any questions or concerns   ---------------------------------------------------------------  I am glad you have gotten your COVID-19 vaccination!  Even though you are fully vaccinated you should continue to follow CDC and state/local guidelines.  ---------------------------------------------------------------   At [facility] we value your feedback. You may receive a survey about your visit today. Please share your experience as we strive to create trusting relationships with our patients to provide genuine, compassionate, quality care.  We appreciate your understanding and patience as we review any laboratory studies, imaging, and other diagnostic tests that are ordered as we care for you. Our office policy is 5 business days for review of these results, and any emergent or urgent results are addressed in a timely manner for your best interest. If you do not hear from our office in 1 week, please contact us.   We also encourage the use of MyChart, which contains your medical information for your review as well. If you are not enrolled in this feature, an access code is on this after visit summary for your convenience. Thank you for allowing [place] to be involved in your care.  It was great to see you today!  I hope you have a great spring!!

## 2020-09-15 NOTE — Telephone Encounter (Signed)
Pre-op/covid test 09/24/20 at 11:15am. Unable to reach pt on his phone. Called and informed his daughter Caryl Comes) of appt. Pt had requested his daughter be called if unable to reach him. Procedure instructions were given to pt at OV this morning. Stated he already had prep. Appt letter mailed.

## 2020-09-15 NOTE — Progress Notes (Signed)
  Referring Provider: Burdine, Steven E, MD Primary Care Physician:  Burdine, Steven E, MD Primary GI:  Dr. Rourk  Chief Complaint  Patient presents with  . Hepatitis C    F/u  . Colonoscopy    Needs to get r/s'd    HPI:   Ryan King is a 64 y.o. male who presents to follow-up on hepatitis C, fibrosis, and to reschedule procedure.  Patient was last seen in our office 03/19/2020 for chronic hep C and liver fibrosis.  Hep C likely due to chronic drug use but no longer using.  Colonoscopy up-to-date and due in 2021 due to high risk for CRC from family history of colon cancer in his brother.  Labs on 02/28/2020 confirmed eradication and sustained very marked response.  Patient was no-show to his Covid testing for his colonoscopy scheduled for 01/27/2020.  At his last visit discussed HCV care status and educated him on the possibility of reinfection.  No other overt GI complaints.  Wanting to reschedule colonoscopy.  Recommended rescheduling, right upper quadrant ultrasound, follow-up in 6 months.  It does not appear right upper quadrant ultrasound was completed.  Attempt x2 to contact patient to schedule colonoscopy with no success.  A letter was sent.  He called our office in February 2022 to reschedule but recommended office visit.  Today he states he doing okay overall. Still not using drugs. Discussed HCV reinfection potential. Denies abdominal pain, N/V, hematochezia, melena, fever, chills, unintentional weight loss. Denies URI or flu-like symptoms. Denies loss of sense of taste or smell. The patient has received COVID-19 vaccination(s). Denies chest pain, dyspnea, dizziness, lightheadedness, syncope, near syncope. Denies any other upper or lower GI symptoms.  States he had an appendiceal infection and needs colonoscopy prior to appendectomy. Hasn't seen surgery as outpatient yet.   Past Medical History:  Diagnosis Date  . Arthritis   . Essential hypertension   . Irregular heart  rate    saw Dr. Samuel McDowell 10/2016; PACs, brief PAT on Holter  . Type 2 diabetes mellitus with diabetic neuropathy ([facility])    type 2    Past Surgical History:  Procedure Laterality Date  . ANTERIOR CERVICAL DECOMP/DISCECTOMY FUSION N/A 01/20/2017   Procedure: Anterior Cervical Discectomy Fusion - Cervical three-Cervical four - Cervical four-Cervical five, possible Cervical four Corpectomy;  Surgeon: Nundkumar, Neelesh, MD;  Location: [facility];  Service: Neurosurgery;  Laterality: N/A;  . Arm Surgery Right 06/2016  . CATARACT EXTRACTION W/PHACO Left 05/01/2017   Procedure: CATARACT EXTRACTION PHACO AND INTRAOCULAR LENS PLACEMENT (IOC);  Surgeon: Hunt, Kerry, MD;  Location: [facility];  Service: Ophthalmology;  Laterality: Left;  CDE: 8.47  . COLONOSCOPY N/A 10/18/2016   Procedure: COLONOSCOPY;  Surgeon: Robert M Rourk, MD;  [facility];  Service: Endoscopy;  Laterality: N/A;  10:00 AM  . EYE SURGERY     right eye cataract  . FRACTURE SURGERY Right 06/2016    Current Outpatient Medications  Medication Sig Dispense Refill  . atorvastatin (LIPITOR) 20 MG tablet Take 20 mg by mouth once a week.    . FARXIGA 5 MG TABS tablet Take 5 mg by mouth every morning.    . glipiZIDE (GLUCOTROL XL) 10 MG 24 hr tablet Take 10 mg by mouth in the morning and at bedtime.    . ibuprofen (ADVIL) 200 MG tablet Take 200 mg by mouth every 6 (six) hours as needed.    . lisinopril (ZESTRIL) 10 MG tablet Take 10 mg by   mouth every evening.    . meloxicam (MOBIC) 15 MG tablet Take 15 mg by mouth daily as needed.    . metFORMIN (GLUCOPHAGE) 1000 MG tablet Take 1,000 mg by mouth 2 (two) times daily with a meal.    . CLENPIQ 10-3.5-12 MG-GM -GM/160ML SOLN Take 320 mLs by mouth once. (Patient not taking: No sig reported)     No current facility-administered medications for this visit.    Allergies as of 09/15/2020  . (No Known Allergies)    Family History  Problem Relation Age of Onset  . Diabetes Mother    . Hypertension Mother   . Lung cancer Brother   . Colon cancer Brother        he thinks so, but not sure  . Multiple sclerosis Brother   . Stroke Brother   . Diabetes Mellitus II Brother     Social History   Socioeconomic History  . Marital status: Single    Spouse name: Not on file  . Number of children: Not on file  . Years of education: Not on file  . Highest education level: Not on file  Occupational History  . Not on file  Tobacco Use  . Smoking status: Current Every Day Smoker    Packs/day: 1.00    Years: 45.00    Pack years: 45.00    Types: Cigarettes  . Smokeless tobacco: Never Used  . Tobacco comment: some days does not smoke  Vaping Use  . Vaping Use: Never used  Substance and Sexual Activity  . Alcohol use: Yes    Comment: Rarely-Once month  . Drug use: Not Currently    Types: "Crack" cocaine    Comment: As of 10/13//2020: none in 2+ months. Previously: As of 01/03/19 "not that much" "maybe months apart" last use a few days ago  . Sexual activity: Not on file  Other Topics Concern  . Not on file  Social History Narrative  . Not on file   Social Determinants of Health   Financial Resource Strain: Not on file  Food Insecurity: Not on file  Transportation Needs: Not on file  Physical Activity: Not on file  Stress: Not on file  Social Connections: Not on file    Subjective: Review of Systems  Constitutional: Negative for chills, fever, malaise/fatigue and weight loss.  HENT: Negative for congestion and sore throat.   Respiratory: Negative for cough and shortness of breath.   Cardiovascular: Negative for chest pain and palpitations.  Gastrointestinal: Negative for abdominal pain, blood in stool, constipation, diarrhea, heartburn, melena, nausea and vomiting.  Musculoskeletal: Negative for joint pain and myalgias.  Skin: Negative for rash.  Neurological: Negative for dizziness and weakness.  Endo/Heme/Allergies: Does not bruise/bleed easily.   Psychiatric/Behavioral: Negative for depression. The patient is not nervous/anxious.   All other systems reviewed and are negative.    Objective: BP (!) 161/84   Pulse 86   Temp (!) 96.8 F (36 C)   Ht 5' 10" (1.778 m)   Wt 152 lb 9.6 oz (69.2 kg)   BMI 21.90 kg/m  Physical Exam Vitals and nursing note reviewed.  Constitutional:      General: He is not in acute distress.    Appearance: Normal appearance. He is normal weight. He is not ill-appearing, toxic-appearing or diaphoretic.  HENT:     Head: Normocephalic and atraumatic.     Nose: No congestion or rhinorrhea.  Eyes:     General: No scleral icterus. Cardiovascular:       Rate and Rhythm: Normal rate and regular rhythm.     Heart sounds: Normal heart sounds.  Pulmonary:     Effort: Pulmonary effort is normal.     Breath sounds: Normal breath sounds.  Abdominal:     General: Bowel sounds are normal. There is no distension.     Palpations: Abdomen is soft. There is no hepatomegaly, splenomegaly or mass.     Tenderness: There is no abdominal tenderness. There is no guarding or rebound.     Hernia: No hernia is present.  Musculoskeletal:     Cervical back: Neck supple.  Skin:    General: Skin is warm and dry.     Coloration: Skin is not jaundiced.     Findings: No bruising or rash.  Neurological:     General: No focal deficit present.     Mental Status: He is alert and oriented to person, place, and time. Mental status is at baseline.  Psychiatric:        Mood and Affect: Mood normal.        Behavior: Behavior normal.        Thought Content: Thought content normal.      Assessment:  Very pleasant 64-year-old male presents for follow-up.  Previous chronic hepatitis C status post treatment and documented SVR.  Previous hospitalization for appendiceal abscess and recommended eventual appendectomy.  However, surgery wants a colonoscopy prior to completing.  He had to cancel a couple times.  At this point we will  proceed with rescheduling of his colonoscopy.  Further recommendations to follow.   Proceed with colonoscopy on propofol/MAC by Dr. Rourk in near future: the risks, benefits, and alternatives have been discussed with the patient in detail. The patient states understanding and desires to proceed.  Patient is currently on Glucotrol, Metformin, Farxiga.  We will make adjustments to his diabetes medicines related to his procedure. The patient is not on any other anticoagulants, anxiolytics, chronic pain medications, antidepressants, antidiabetics, or iron supplements.  We will plan for the procedure on propofol/MAC to promote adequate sedation given his history of drug use.  ASA III (history cocaine use, none in a while; DM, irregular heartbeat history)   Plan: 1. Colonoscopy as described above 2. Follow-up based on post procedure recommendations 3. Call for any worsening or severe symptoms    Thank you for allowing us to participate in the care of Keiandre I Filippone  Delvis Kau, DNP, AGNP-C Adult & Gerontological Nurse Practitioner [facility]   09/15/2020 9:39 AM   Disclaimer: This note was dictated with voice recognition software. Similar sounding words can inadvertently be transcribed and may not be corrected upon review.  

## 2020-09-15 NOTE — H&P (View-Only) (Signed)
Referring Provider: Curlene Labrum, MD Primary Care Physician:  Curlene Labrum, MD Primary GI:  Dr. Gala Romney  Chief Complaint  Patient presents with  . Hepatitis C    F/u  . Colonoscopy    Needs to get r/s'd    HPI:   Ryan King is a 65 y.o. male who presents to follow-up on hepatitis C, fibrosis, and to reschedule procedure.  Patient was last seen in our office 03/19/2020 for chronic hep C and liver fibrosis.  Hep C likely due to chronic drug use but no longer using.  Colonoscopy up-to-date and due in 2021 due to high risk for CRC from family history of colon cancer in his brother.  Labs on 02/28/2020 confirmed eradication and sustained very marked response.  Patient was no-show to his Covid testing for his colonoscopy scheduled for 01/27/2020.  At his last visit discussed HCV care status and educated him on the possibility of reinfection.  No other overt GI complaints.  Wanting to reschedule colonoscopy.  Recommended rescheduling, right upper quadrant ultrasound, follow-up in 6 months.  It does not appear right upper quadrant ultrasound was completed.  Attempt x2 to contact patient to schedule colonoscopy with no success.  A letter was sent.  He called our office in February 2022 to reschedule but recommended office visit.  Today he states he doing okay overall. Still not using drugs. Discussed HCV reinfection potential. Denies abdominal pain, N/V, hematochezia, melena, fever, chills, unintentional weight loss. Denies URI or flu-like symptoms. Denies loss of sense of taste or smell. The patient has received COVID-19 vaccination(s). Denies chest pain, dyspnea, dizziness, lightheadedness, syncope, near syncope. Denies any other upper or lower GI symptoms.  States he had an appendiceal infection and needs colonoscopy prior to appendectomy. Hasn't seen surgery as outpatient yet.   Past Medical History:  Diagnosis Date  . Arthritis   . Essential hypertension   . Irregular heart  rate    saw Dr. Rozann Lesches 10/2016; PACs, brief PAT on Holter  . Type 2 diabetes mellitus with diabetic neuropathy ([place])    type 2    Past Surgical History:  Procedure Laterality Date  . ANTERIOR CERVICAL DECOMP/DISCECTOMY FUSION N/A 01/20/2017   Procedure: Anterior Cervical Discectomy Fusion - Cervical three-Cervical four - Cervical four-Cervical five, possible Cervical four Corpectomy;  Surgeon: Consuella Lose, MD;  Location: [facility] [facility];  Service: Neurosurgery;  Laterality: N/A;  . Arm Surgery Right 06/2016  . CATARACT EXTRACTION W/PHACO Left 05/01/2017   Procedure: CATARACT EXTRACTION PHACO AND INTRAOCULAR LENS PLACEMENT (IOC);  Surgeon: Tonny Branch, MD;  Location: [facility];  Service: Ophthalmology;  Laterality: Left;  CDE: 8.47  . COLONOSCOPY N/A 10/18/2016   Procedure: COLONOSCOPY;  Surgeon: Daneil Dolin, MD;  Location: [facility];  Service: Endoscopy;  Laterality: N/A;  10:00 AM  . EYE SURGERY     right eye cataract  . FRACTURE SURGERY Right 06/2016    Current Outpatient Medications  Medication Sig Dispense Refill  . atorvastatin (LIPITOR) 20 MG tablet Take 20 mg by mouth once a week.    Marland Kitchen FARXIGA 5 MG TABS tablet Take 5 mg by mouth every morning.    Marland Kitchen glipiZIDE (GLUCOTROL XL) 10 MG 24 hr tablet Take 10 mg by mouth in the morning and at bedtime.    Marland Kitchen ibuprofen (ADVIL) 200 MG tablet Take 200 mg by mouth every 6 (six) hours as needed.    Marland Kitchen lisinopril (ZESTRIL) 10 MG tablet Take 10 mg by  mouth every evening.    . meloxicam (MOBIC) 15 MG tablet Take 15 mg by mouth daily as needed.    . metFORMIN (GLUCOPHAGE) 1000 MG tablet Take 1,000 mg by mouth 2 (two) times daily with a meal.    . CLENPIQ 10-3.5-12 MG-GM -GM/160ML SOLN Take 320 mLs by mouth once. (Patient not taking: No sig reported)     No current facility-administered medications for this visit.    Allergies as of 09/15/2020  . (No Known Allergies)    Family History  Problem Relation Age of Onset  . Diabetes Mother    . Hypertension Mother   . Lung cancer Brother   . Colon cancer Brother        he thinks so, but not sure  . Multiple sclerosis Brother   . Stroke Brother   . Diabetes Mellitus II Brother     Social History   Socioeconomic History  . Marital status: Single    Spouse name: Not on file  . Number of children: Not on file  . Years of education: Not on file  . Highest education level: Not on file  Occupational History  . Not on file  Tobacco Use  . Smoking status: Current Every Day Smoker    Packs/day: 1.00    Years: 45.00    Pack years: 45.00    Types: Cigarettes  . Smokeless tobacco: Never Used  . Tobacco comment: some days does not smoke  Vaping Use  . Vaping Use: Never used  Substance and Sexual Activity  . Alcohol use: Yes    Comment: Rarely-Once month  . Drug use: Not Currently    Types: "Crack" cocaine    Comment: As of 10/13//2020: none in 2+ months. Previously: As of 01/03/19 "not that much" "maybe months apart" last use a few days ago  . Sexual activity: Not on file  Other Topics Concern  . Not on file  Social History Narrative  . Not on file   Social Determinants of Health   Financial Resource Strain: Not on file  Food Insecurity: Not on file  Transportation Needs: Not on file  Physical Activity: Not on file  Stress: Not on file  Social Connections: Not on file    Subjective: Review of Systems  Constitutional: Negative for chills, fever, malaise/fatigue and weight loss.  HENT: Negative for congestion and sore throat.   Respiratory: Negative for cough and shortness of breath.   Cardiovascular: Negative for chest pain and palpitations.  Gastrointestinal: Negative for abdominal pain, blood in stool, constipation, diarrhea, heartburn, melena, nausea and vomiting.  Musculoskeletal: Negative for joint pain and myalgias.  Skin: Negative for rash.  Neurological: Negative for dizziness and weakness.  Endo/Heme/Allergies: Does not bruise/bleed easily.   Psychiatric/Behavioral: Negative for depression. The patient is not nervous/anxious.   All other systems reviewed and are negative.    Objective: BP (!) 161/84   Pulse 86   Temp (!) 96.8 F (36 C)   Ht _0  (1.778 m)   Wt 152 lb 9.6 oz (69.2 kg)   BMI 21.90 kg/m  Physical Exam Vitals and nursing note reviewed.  Constitutional:      General: He is not in acute distress.    Appearance: Normal appearance. He is normal weight. He is not ill-appearing, toxic-appearing or diaphoretic.  HENT:     Head: Normocephalic and atraumatic.     Nose: No congestion or rhinorrhea.  Eyes:     General: No scleral icterus. Cardiovascular:  Rate and Rhythm: Normal rate and regular rhythm.     Heart sounds: Normal heart sounds.  Pulmonary:     Effort: Pulmonary effort is normal.     Breath sounds: Normal breath sounds.  Abdominal:     General: Bowel sounds are normal. There is no distension.     Palpations: Abdomen is soft. There is no hepatomegaly, splenomegaly or mass.     Tenderness: There is no abdominal tenderness. There is no guarding or rebound.     Hernia: No hernia is present.  Musculoskeletal:     Cervical back: Neck supple.  Skin:    General: Skin is warm and dry.     Coloration: Skin is not jaundiced.     Findings: No bruising or rash.  Neurological:     General: No focal deficit present.     Mental Status: He is alert and oriented to person, place, and time. Mental status is at baseline.  Psychiatric:        Mood and Affect: Mood normal.        Behavior: Behavior normal.        Thought Content: Thought content normal.      Assessment:  Very pleasant 65 year old male presents for follow-up.  Previous chronic hepatitis C status post treatment and documented SVR.  Previous hospitalization for appendiceal abscess and recommended eventual appendectomy.  However, surgery wants a colonoscopy prior to completing.  He had to cancel a couple times.  At this point we will  proceed with rescheduling of his colonoscopy.  Further recommendations to follow.   Proceed with colonoscopy on propofol/MAC by Dr. Gala Romney in near future: the risks, benefits, and alternatives have been discussed with the patient in detail. The patient states understanding and desires to proceed.  Patient is currently on Glucotrol, Metformin, Farxiga.  We will make adjustments to his diabetes medicines related to his procedure. The patient is not on any other anticoagulants, anxiolytics, chronic pain medications, antidepressants, antidiabetics, or iron supplements.  We will plan for the procedure on propofol/MAC to promote adequate sedation given his history of drug use.  ASA III (history cocaine use, none in a while; DM, irregular heartbeat history)   Plan: 1. Colonoscopy as described above 2. Follow-up based on post procedure recommendations 3. Call for any worsening or severe symptoms    Thank you for allowing [place] to participate in the care of Lynden I Linda Hedges, DNP, AGNP-C Adult & Gerontological Nurse Practitioner [facility]   09/15/2020 9:39 AM   Disclaimer: This note was dictated with voice recognition software. Similar sounding words can inadvertently be transcribed and may not be corrected upon review.

## 2020-09-23 NOTE — Patient Instructions (Signed)
Ryan King  09/23/2020     @PREFPERIOPPHARMACY @   Your procedure is scheduled on  09/28/2020   Report to [facility] at  0700   A.M.  Call this number if you have problems the morning of surgery:  [phone]   Remember:  Follow the diet and prep instructions given to you by the office.                      Take these medicines the morning of surgery with A SIP OF WATER  None.  DO NOT take any medications for diabetes the morning of your procedure.  If your glucose is 70 or below the morning of your procedure, drink 1/2 cup of clear juice and recheck your glucose in 15 minutes. If your glucose is still 70 or below, call [phone] for instructions.  If your glucose is 300 or above the morning of your procedure, call [phone] for instructions.     Please brush your teeth  Do not wear jewelry, make-up or nail polish.  Do not wear lotions, powders, or perfumes, or deodorant.  Do not shave 48 hours prior to surgery.  Men may shave face and neck.  Do not bring valuables to the hospital.  [facility] is not responsible for any belongings or valuables.   Contacts, dentures or bridgework may not be worn into surgery.  Leave your suitcase in the car.  After surgery it may be brought to your room.  For patients admitted to the hospital, discharge time will be determined by your treatment team.  Patients discharged the day of surgery will not be allowed to drive home and must have someone with them for 24 hours.     Special instructions:   DO NOT smoke tobacco or vape for 24 hours before your procedure.    Please read over the following fact sheets that you were given. Anesthesia Post-op Instructions and Care and Recovery After Surgery       Colonoscopy, Adult, Care After This sheet gives you information about how to care for yourself after your procedure. Your health care provider may also give you more specific instructions. If you have problems or  questions, contact your health care provider. What can I expect after the procedure? After the procedure, it is common to have:  A small amount of blood in your stool for 24 hours after the procedure.  Some gas.  Mild cramping or bloating of your abdomen. Follow these instructions at home: Eating and drinking  Drink enough fluid to keep your urine pale yellow.  Follow instructions from your health care provider about eating or drinking restrictions.  Resume your normal diet as instructed by your health care provider. Avoid heavy or fried foods that are hard to digest.   Activity  Rest as told by your health care provider.  Avoid sitting for a long time without moving. Get up to take short walks every 1-2 hours. This is important to improve blood flow and breathing. Ask for help if you feel weak or unsteady.  Return to your normal activities as told by your health care provider. Ask your health care provider what activities are safe for you. Managing cramping and bloating  Try walking around when you have cramps or feel bloated.  Apply heat to your abdomen as told by your health care provider. Use the heat source that your health care provider recommends, such as a moist heat pack  or a heating pad. ? Place a towel between your skin and the heat source. ? Leave the heat on for 20-30 minutes. ? Remove the heat if your skin turns bright red. This is especially important if you are unable to feel pain, heat, or cold. You may have a greater risk of getting burned.   General instructions  If you were given a sedative during the procedure, it can affect you for several hours. Do not drive or operate machinery until your health care provider says that it is safe.  For the first 24 hours after the procedure: ? Do not sign important documents. ? Do not drink alcohol. ? Do your regular daily activities at a slower pace than normal. ? Eat soft foods that are easy to digest.  Take  over-the-counter and prescription medicines only as told by your health care provider.  Keep all follow-up visits as told by your health care provider. This is important. Contact a health care provider if:  You have blood in your stool 2-3 days after the procedure. Get help right away if you have:  More than a small spotting of blood in your stool.  Large blood clots in your stool.  Swelling of your abdomen.  Nausea or vomiting.  A fever.  Increasing pain in your abdomen that is not relieved with medicine. Summary  After the procedure, it is common to have a small amount of blood in your stool. You may also have mild cramping and bloating of your abdomen.  If you were given a sedative during the procedure, it can affect you for several hours. Do not drive or operate machinery until your health care provider says that it is safe.  Get help right away if you have a lot of blood in your stool, nausea or vomiting, a fever, or increased pain in your abdomen. This information is not intended to replace advice given to you by your health care provider. Make sure you discuss any questions you have with your health care provider. Document Revised: 05/31/2019 Document Reviewed: 12/31/2018 Elsevier Patient Education  2021 [place] After This sheet gives you information about how to care for yourself after your procedure. Your health care provider may also give you more specific instructions. If you have problems or questions, contact your health care provider. What can I expect after the procedure? After the procedure, it is common to have:  Tiredness.  Forgetfulness about what happened after the procedure.  Impaired judgment for important decisions.  Nausea or vomiting.  Some difficulty with balance. Follow these instructions at home: For the time period you were told by your health care provider:  Rest as needed.  Do not participate in  activities where you could fall or become injured.  Do not drive or use machinery.  Do not drink alcohol.  Do not take sleeping pills or medicines that cause drowsiness.  Do not make important decisions or sign legal documents.  Do not take care of children on your own.      Eating and drinking  Follow the diet that is recommended by your health care provider.  Drink enough fluid to keep your urine pale yellow.  If you vomit: ? Drink water, juice, or soup when you can drink without vomiting. ? Make sure you have little or no nausea before eating solid foods. General instructions  Have a responsible adult stay with you for the time you are told. It is important to have  someone help care for you until you are awake and alert.  Take over-the-counter and prescription medicines only as told by your health care provider.  If you have sleep apnea, surgery and certain medicines can increase your risk for breathing problems. Follow instructions from your health care provider about wearing your sleep device: ? Anytime you are sleeping, including during daytime naps. ? While taking prescription pain medicines, sleeping medicines, or medicines that make you drowsy.  Avoid smoking.  Keep all follow-up visits as told by your health care provider. This is important. Contact a health care provider if:  You keep feeling nauseous or you keep vomiting.  You feel light-headed.  You are still sleepy or having trouble with balance after 24 hours.  You develop a rash.  You have a fever.  You have redness or swelling around the IV site. Get help right away if:  You have trouble breathing.  You have new-onset confusion at home. Summary  For several hours after your procedure, you may feel tired. You may also be forgetful and have poor judgment.  Have a responsible adult stay with you for the time you are told. It is important to have someone help care for you until you are awake and  alert.  Rest as told. Do not drive or operate machinery. Do not drink alcohol or take sleeping pills.  Get help right away if you have trouble breathing, or if you suddenly become confused. This information is not intended to replace advice given to you by your health care provider. Make sure you discuss any questions you have with your health care provider. Document Revised: 02/20/2020 Document Reviewed: 05/09/2019 Elsevier Patient Education  2021 [employer].

## 2020-09-24 ENCOUNTER — Encounter (HOSPITAL_COMMUNITY)
Admission: RE | Admit: 2020-09-24 | Discharge: 2020-09-24 | Disposition: A | Discharge status: Home / Self Care | Payer: Medicare Other | Source: Ambulatory Visit | Attending: Internal Medicine | Admitting: Internal Medicine

## 2020-09-24 ENCOUNTER — Other Ambulatory Visit (HOSPITAL_COMMUNITY)
Admission: RE | Admit: 2020-09-24 | Discharge: 2020-09-24 | Disposition: A | Discharge status: Home / Self Care | Payer: Medicare Other | Source: Ambulatory Visit | Attending: Internal Medicine | Admitting: Internal Medicine

## 2020-09-24 ENCOUNTER — Encounter (HOSPITAL_COMMUNITY): Payer: Self-pay

## 2020-09-24 ENCOUNTER — Other Ambulatory Visit: Payer: Self-pay

## 2020-09-24 DIAGNOSIS — Z01818 Encounter for other preprocedural examination: Secondary | ICD-10-CM | POA: Diagnosis not present

## 2020-09-24 DIAGNOSIS — Z20822 Contact with and (suspected) exposure to covid-19: Secondary | ICD-10-CM | POA: Diagnosis not present

## 2020-09-24 HISTORY — DX: Personal history of other infectious and parasitic diseases: Z86.19

## 2020-09-24 LAB — BASIC METABOLIC PANEL
Anion gap: 9 (ref 5–15)
BUN: 20 mg/dL (ref 8–23)
CO2: 23 mmol/L (ref 22–32)
Calcium: 9.2 mg/dL (ref 8.9–10.3)
Chloride: 107 mmol/L (ref 98–111)
Creatinine, Ser: 0.92 mg/dL (ref 0.61–1.24)
GFR, Estimated: >60 mL/min (ref >60)
Glucose, Bld: 211 mg/dL — ABNORMAL HIGH (ref 70–99)
Potassium: 4.2 mmol/L (ref 3.5–5.1)
Sodium: 139 mmol/L (ref 135–145)

## 2020-09-25 LAB — SARS CORONAVIRUS 2 (TAT 6-24 HRS): SARS Coronavirus 2: NEGATIVE

## 2020-09-28 ENCOUNTER — Ambulatory Visit (HOSPITAL_COMMUNITY)
Admission: RE | Admit: 2020-09-28 | Discharge: 2020-09-28 | Disposition: A | Discharge status: Home / Self Care | Payer: Medicare Other | Source: Ambulatory Visit | Attending: Internal Medicine | Admitting: Internal Medicine

## 2020-09-28 ENCOUNTER — Ambulatory Visit (HOSPITAL_COMMUNITY): Payer: Medicare Other | Admitting: Anesthesiology

## 2020-09-28 ENCOUNTER — Encounter (HOSPITAL_COMMUNITY)
Admission: RE | Disposition: A | Discharge status: Home / Self Care | Payer: Self-pay | Source: Ambulatory Visit | Attending: Internal Medicine

## 2020-09-28 ENCOUNTER — Encounter (HOSPITAL_COMMUNITY): Payer: Self-pay | Admitting: Internal Medicine

## 2020-09-28 DIAGNOSIS — Z86010 Personal history of colonic polyps: Secondary | ICD-10-CM

## 2020-09-28 DIAGNOSIS — D122 Benign neoplasm of ascending colon: Secondary | ICD-10-CM | POA: Insufficient documentation

## 2020-09-28 DIAGNOSIS — Z09 Encounter for follow-up examination after completed treatment for conditions other than malignant neoplasm: Secondary | ICD-10-CM | POA: Diagnosis present

## 2020-09-28 DIAGNOSIS — Z791 Long term (current) use of non-steroidal anti-inflammatories (NSAID): Secondary | ICD-10-CM | POA: Insufficient documentation

## 2020-09-28 DIAGNOSIS — K648 Other hemorrhoids: Secondary | ICD-10-CM | POA: Insufficient documentation

## 2020-09-28 DIAGNOSIS — E1140 Type 2 diabetes mellitus with diabetic neuropathy, unspecified: Secondary | ICD-10-CM | POA: Diagnosis not present

## 2020-09-28 DIAGNOSIS — Z8619 Personal history of other infectious and parasitic diseases: Secondary | ICD-10-CM | POA: Insufficient documentation

## 2020-09-28 DIAGNOSIS — Z7984 Long term (current) use of oral hypoglycemic drugs: Secondary | ICD-10-CM | POA: Diagnosis not present

## 2020-09-28 DIAGNOSIS — K635 Polyp of colon: Secondary | ICD-10-CM

## 2020-09-28 DIAGNOSIS — D124 Benign neoplasm of descending colon: Secondary | ICD-10-CM | POA: Insufficient documentation

## 2020-09-28 DIAGNOSIS — F17210 Nicotine dependence, cigarettes, uncomplicated: Secondary | ICD-10-CM | POA: Diagnosis not present

## 2020-09-28 DIAGNOSIS — Z800 Family history of malignant neoplasm of digestive organs: Secondary | ICD-10-CM | POA: Diagnosis not present

## 2020-09-28 DIAGNOSIS — Z1211 Encounter for screening for malignant neoplasm of colon: Secondary | ICD-10-CM | POA: Diagnosis not present

## 2020-09-28 DIAGNOSIS — Z79899 Other long term (current) drug therapy: Secondary | ICD-10-CM | POA: Insufficient documentation

## 2020-09-28 HISTORY — PX: POLYPECTOMY: SHX149

## 2020-09-28 HISTORY — PX: COLONOSCOPY WITH PROPOFOL: SHX5780

## 2020-09-28 LAB — GLUCOSE, CAPILLARY
Glucose-Capillary: 127 mg/dL — ABNORMAL HIGH (ref 70–99)
Glucose-Capillary: 166 mg/dL — ABNORMAL HIGH (ref 70–99)

## 2020-09-28 SURGERY — COLONOSCOPY WITH PROPOFOL
Anesthesia: General

## 2020-09-28 MED ORDER — PROPOFOL 10 MG/ML IV BOLUS
INTRAVENOUS | Status: DC | PRN
  Administered 2020-09-28: 50 mg via INTRAVENOUS
  Administered 2020-09-28 (×3): 20 mg via INTRAVENOUS

## 2020-09-28 MED ORDER — GLUCAGON HCL RDNA (DIAGNOSTIC) 1 MG IJ SOLR
INTRAMUSCULAR | Status: AC
  Filled 2020-09-28: qty 1

## 2020-09-28 MED ORDER — STERILE WATER FOR IRRIGATION IR SOLN
Status: DC | PRN
  Administered 2020-09-28: 200 mL

## 2020-09-28 MED ORDER — GLUCAGON HCL RDNA (DIAGNOSTIC) 1 MG IJ SOLR
INTRAMUSCULAR | Status: DC | PRN
  Administered 2020-09-28: .5 mg via INTRAVENOUS

## 2020-09-28 MED ORDER — LACTATED RINGERS IV SOLN
INTRAVENOUS | Status: DC
  Administered 2020-09-28: 1000 mL via INTRAVENOUS

## 2020-09-28 MED ORDER — PROPOFOL 10 MG/ML IV BOLUS
INTRAVENOUS | Status: AC
  Filled 2020-09-28: qty 40

## 2020-09-28 MED ORDER — PROPOFOL 500 MG/50ML IV EMUL
INTRAVENOUS | Status: DC | PRN
  Administered 2020-09-28: 150 ug/kg/min via INTRAVENOUS

## 2020-09-28 NOTE — Op Note (Signed)
[facility] Patient Name: Ryan King Procedure Date: 09/28/2020 8:00 AM MRN: [id number] Date of Birth: 09/25/1955 Attending MD: Norvel Richards , MD CSN: [id number] Age: 65 Admit Type: Outpatient Procedure:                Colonoscopy Indications:              High risk colon cancer surveillance: Personal                            history of colonic polyps Providers:                Norvel Richards, MD, Crystal Page, Aram Candela Referring MD:             Curlene Labrum Medicines:                Propofol per Anesthesia Complications:            No immediate complications. Estimated Blood Loss:     Estimated blood loss was minimal. Procedure:                Pre-Anesthesia Assessment:                           - Prior to the procedure, a History and Physical                            was performed, and patient medications and                            allergies were reviewed. The patient's tolerance of                            previous anesthesia was also reviewed. The risks                            and benefits of the procedure and the sedation                            options and risks were discussed with the patient.                            All questions were answered, and informed consent                            was obtained. Prior Anticoagulants: The patient has                            taken no previous anticoagulant or antiplatelet                            agents. ASA Grade Assessment: III - A patient with                            severe systemic disease. After reviewing the risks  and benefits, the patient was deemed in                            satisfactory condition to undergo the procedure.                           After obtaining informed consent, the colonoscope                            was passed under direct vision. Throughout the                            procedure, the patient's blood pressure, pulse,  and                            oxygen saturations were monitored continuously. The                            CF-[id number] ([id number]) scope was introduced through                            the anus and advanced to the 5 cm into the ileum.                            The colonoscopy was performed without difficulty.                            The patient tolerated the procedure well. The                            quality of the bowel preparation was adequate. Scope In: 8:32:56 AM Scope Out: 9:09:00 AM Scope Withdrawal Time: 0 hours 27 minutes 9 seconds  Total Procedure Duration: 0 hours 36 minutes 4 seconds  Findings:      The perianal and digital rectal examinations were normal.      A 8 mm polyp was found in the ascending colon. The polyp was       semi-pedunculated. The polyp was removed with a hot snare. Resection and       retrieval were complete. Estimated blood loss: none.      A 6 mm polyp was found in the descending colon. The polyp was       semi-pedunculated. The polyp was removed with a cold snare. Resection       and retrieval were complete. Estimated blood loss was minimal.      Distal 10 cm of TI appeared normal      Non-bleeding internal hemorrhoids were found during retroflexion.       Initially, retroflexion views suggested distal polypoid tissue. Rectal       mucosa would not hold air quite a bit of spasm. I gave 0.5 mg of       glucagon obtained the pediatric colonoscope to get better       visualization.. The patient actually had prominent hemorrhoid columns       with apical anal papilla.      The exam was otherwise without abnormality on direct and retroflexion  views. Impression:               - One 8 mm polyp in the ascending colon, removed                            with a hot snare. Resected and retrieved.                           - One 6 mm polyp in the descending colon, removed                            with a cold snare. Resected and retrieved.                            -Prominent internal hemorrhoids and anal papilla.                            Normal-appearing terminal ileum.                           - The examination was otherwise normal on direct                            and retroflexion views. Moderate Sedation:      Moderate (conscious) sedation was personally administered by an       anesthesia professional. The following parameters were monitored: oxygen       saturation, heart rate, blood pressure, respiratory rate, EKG, adequacy       of pulmonary ventilation, and response to care. Recommendation:           - Patient has a contact number available for                            emergencies. The signs and symptoms of potential                            delayed complications were discussed with the                            patient. Return to normal activities tomorrow.                            Written discharge instructions were provided to the                            patient.                           - Advance diet as tolerated.                           - Continue present medications.                           - Repeat colonoscopy date to be determined after  pending pathology results are reviewed for                            surveillance.                           - Return to GI office (date not yet determined). Procedure Code(s):        --- Professional ---                           [phone], Colonoscopy, flexible; with removal of                            tumor(s), polyp(s), or other lesion(s) by snare                            technique Diagnosis Code(s):        --- Professional ---                           Z86.010, Personal history of colonic polyps                           K63.5, Polyp of colon CPT copyright 2019 American Medical Association. All rights reserved. The codes documented in this report are preliminary and upon coder review may  be revised to meet current compliance  requirements. Ryan King. Ryan Steely, MD Norvel Richards, MD 09/28/2020 9:18:33 AM This report has been signed electronically. Number of Addenda: 0

## 2020-09-28 NOTE — Anesthesia Preprocedure Evaluation (Addendum)
Anesthesia Evaluation  Patient identified by MRN, date of birth, ID band Patient awake    Reviewed: Allergy & Precautions, NPO status , Patient's Chart, lab work & pertinent test results  History of Anesthesia Complications Negative for: history of anesthetic complications  Airway Mallampati: II  TM Distance: >3 FB Neck ROM: Limited   Comment: ACDF, Neck pain Dental  (+) Upper Dentures, Lower Dentures   Pulmonary Current SmokerPatient did not abstain from smoking.,    Pulmonary exam normal breath sounds clear to auscultation       Cardiovascular Exercise Tolerance: Good hypertension, Pt. 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)    GI/Hepatic negative GI ROS, (+) Hepatitis -, C  Endo/Other  diabetes, Well Controlled, Type 2, Oral Hypoglycemic Agents  Renal/GU      Musculoskeletal  (+) Arthritis  (ACDF), Osteoarthritis,    Abdominal   Peds  Hematology   Anesthesia Other Findings   Reproductive/Obstetrics                           Anesthesia Physical Anesthesia Plan  ASA: II  Anesthesia Plan: General   Post-op Pain Management:    Induction: Intravenous  PONV Risk Score and Plan: Propofol infusion  Airway Management Planned: Nasal Cannula and Natural Airway  Additional Equipment:   Intra-op Plan:   Post-operative Plan:   Informed Consent: I have reviewed the patients History and Physical, chart, labs and discussed the procedure including the risks, benefits and alternatives for the proposed anesthesia with the patient or authorized representative who has indicated his/her understanding and acceptance.     Dental advisory given  Plan Discussed with: CRNA and Surgeon  Anesthesia Plan Comments:        Anesthesia Quick Evaluation

## 2020-09-28 NOTE — Interval H&P Note (Signed)
History and Physical Interval Note:  09/28/2020 8:12 AM  Ryan King  has presented today for surgery, with the diagnosis of family history of colorectal cancer, history of colon polyps.  The various methods of treatment have been discussed with the patient and family. After consideration of risks, benefits and other options for treatment, the patient has consented to  Procedure(s) with comments: COLONOSCOPY WITH PROPOFOL (N/A) - AM (diabetic) as a surgical intervention.  The patient's history has been reviewed, patient examined, no change in status, stable for surgery.  I have reviewed the patient's chart and labs.  Questions were answered to the patient's satisfaction.     Ryan King  No change.  Surveillance colonoscopy per plan. The risks, benefits, limitations, alternatives and imponderables have been reviewed with the patient. Questions have been answered. All parties are agreeable.

## 2020-09-28 NOTE — Discharge Instructions (Signed)
Colonoscopy Discharge Instructions  Read the instructions outlined below and refer to this sheet in the next few weeks. These discharge instructions provide you with general information on caring for yourself after you leave the hospital. Your doctor may also give you specific instructions. While your treatment has been planned according to the most current medical practices available, unavoidable complications occasionally occur. If you have any problems or questions after discharge, call Dr. Gala Romney at [phone]. ACTIVITY  You may resume your regular activity, but move at a slower pace for the next 24 hours.   Take frequent rest periods for the next 24 hours.   Walking will help get rid of the air and reduce the bloated feeling in your belly (abdomen).   No driving for 24 hours (because of the medicine (anesthesia) used during the test).    Do not sign any important legal documents or operate any machinery for 24 hours (because of the anesthesia used during the test).  NUTRITION  Drink plenty of fluids.   You may resume your normal diet as instructed by your doctor.   Begin with a light meal and progress to your normal diet. Heavy or fried foods are harder to digest and may make you feel sick to your stomach (nauseated).   Avoid alcoholic beverages for 24 hours or as instructed.  MEDICATIONS  You may resume your normal medications unless your doctor tells you otherwise.  WHAT YOU CAN EXPECT TODAY  Some feelings of bloating in the abdomen.   Passage of more gas than usual.   Spotting of blood in your stool or on the toilet paper.  IF YOU HAD POLYPS REMOVED DURING THE COLONOSCOPY:  No aspirin products for 7 days or as instructed.   No alcohol for 7 days or as instructed.   Eat a soft diet for the next 24 hours.  FINDING OUT THE RESULTS OF YOUR TEST Not all test results are available during your visit. If your test results are not back during the visit, make an appointment  with your caregiver to find out the results. Do not assume everything is normal if you have not heard from your caregiver or the medical facility. It is important for you to follow up on all of your test results.  SEEK IMMEDIATE MEDICAL ATTENTION IF:  You have more than a spotting of blood in your stool.   Your belly is swollen (abdominal distention).   You are nauseated or vomiting.   You have a temperature over 101.   You have abdominal pain or discomfort that is severe or gets worse throughout the day.   2 colon polyps removed today  Further recommendations to follow pending review of pathology report  At patient request, I called Amado Coe at [phone] -relay information.  Amado Coe  stated daughter was coming to pick patient up.     Colon Polyps  Colon polyps are tissue growths inside the colon, which is part of the large intestine. They are one of the types of polyps that can grow in the body. A polyp may be a round bump or a mushroom-shaped growth. You could have one polyp or more than one. Most colon polyps are noncancerous (benign). However, some colon polyps can become cancerous over time. Finding and removing the polyps early can help prevent this. What are the causes? The exact cause of colon polyps is not known. What increases the risk? The following factors may make you more likely to develop this condition:  Having  a family history of colorectal cancer or colon polyps.  Being older than 65 years of age.  Being younger than 65 years of age and having a significant family history of colorectal cancer or colon polyps or a genetic condition that puts you at higher risk of getting colon polyps.  Having inflammatory bowel disease, such as ulcerative colitis or Crohn's disease.  Having certain conditions passed from parent to child (hereditary conditions), such as: ? Familial adenomatous polyposis (FAP). ? Lynch syndrome. ? Turcot syndrome. ? Peutz-Jeghers  syndrome. ? MUTYH-associated polyposis (MAP).  Being overweight.  Certain lifestyle factors. These include smoking cigarettes, drinking too much alcohol, not getting enough exercise, and eating a diet that is high in fat and red meat and low in fiber.  Having had childhood cancer that was treated with radiation of the abdomen. What are the signs or symptoms? Many times, there are no symptoms. If you have symptoms, they may include:  Blood coming from the rectum during a bowel movement.  Blood in the stool (feces). The blood may be bright red or very dark in color.  Pain in the abdomen.  A change in bowel habits, such as constipation or diarrhea. How is this diagnosed? This condition is diagnosed with a colonoscopy. This is a procedure in which a lighted, flexible scope is inserted into the opening between the buttocks (anus) and then passed into the colon to examine the area. Polyps are sometimes found when a colonoscopy is done as part of routine cancer screening tests. How is this treated? This condition is treated by removing any polyps that are found. Most polyps can be removed during a colonoscopy. Those polyps will then be tested for cancer. Additional treatment may be needed depending on the results of testing. Follow these instructions at home: Eating and drinking  Eat foods that are high in fiber, such as fruits, vegetables, and whole grains.  Eat foods that are high in calcium and vitamin D, such as milk, cheese, yogurt, eggs, liver, fish, and broccoli.  Limit foods that are high in fat, such as fried foods and desserts.  Limit the amount of red meat, precooked or cured meat, or other processed meat that you eat, such as hot dogs, sausages, bacon, or meat loaves.  Limit sugary drinks.   Lifestyle  Maintain a healthy weight, or lose weight if recommended by your health care provider.  Exercise every day or as told by your health care provider.  Do not use any  products that contain nicotine or tobacco, such as cigarettes, e-cigarettes, and chewing tobacco. If you need help quitting, ask your health care provider.  Do not drink alcohol if: ? Your health care provider tells you not to drink. ? You are pregnant, may be pregnant, or are planning to become pregnant.  If you drink alcohol: ? Limit how much you use to:  0-1 drink a day for women.  0-2 drinks a day for men. ? Know how much alcohol is in your drink. In the [place] one drink equals one 12 oz bottle of beer (355 mL), one 5 oz glass of wine (148 mL), or one 1 oz glass of hard liquor (44 mL). General instructions  Take over-the-counter and prescription medicines only as told by your health care provider.  Keep all follow-up visits. This is important. This includes having regularly scheduled colonoscopies. Talk to your health care provider about when you need a colonoscopy. Contact a health care provider if:  You have new or  worsening bleeding during a bowel movement.  You have new or increased blood in your stool.  You have a change in bowel habits.  You lose weight for no known reason. Summary  Colon polyps are tissue growths inside the colon, which is part of the large intestine. They are one type of polyp that can grow in the body.  Most colon polyps are noncancerous (benign), but some can become cancerous over time.  This condition is diagnosed with a colonoscopy.  This condition is treated by removing any polyps that are found. Most polyps can be removed during a colonoscopy. This information is not intended to replace advice given to you by your health care provider. Make sure you discuss any questions you have with your health care provider. Document Revised: 09/25/2019 Document Reviewed: 09/25/2019 Elsevier Patient Education  2021 [employer] This sheet gives you information about how to care for yourself after your  procedure. Your health care provider may also give you more specific instructions. If you have problems or questions, contact your health care provider. What can I expect after the procedure? After the procedure, it is common to have:  Tiredness.  Forgetfulness about what happened after the procedure.  Impaired judgment for important decisions.  Nausea or vomiting.  Some difficulty with balance. Follow these instructions at home: For the time period you were told by your health care provider:  Rest as needed.  Do not participate in activities where you could fall or become injured.  Do not drive or use machinery.  Do not drink alcohol.  Do not take sleeping pills or medicines that cause drowsiness.  Do not make important decisions or sign legal documents.  Do not take care of children on your own.      Eating and drinking  Follow the diet that is recommended by your health care provider.  Drink enough fluid to keep your urine pale yellow.  If you vomit: ? Drink water, juice, or soup when you can drink without vomiting. ? Make sure you have little or no nausea before eating solid foods. General instructions  Have a responsible adult stay with you for the time you are told. It is important to have someone help care for you until you are awake and alert.  Take over-the-counter and prescription medicines only as told by your health care provider.  If you have sleep apnea, surgery and certain medicines can increase your risk for breathing problems. Follow instructions from your health care provider about wearing your sleep device: ? Anytime you are sleeping, including during daytime naps. ? While taking prescription pain medicines, sleeping medicines, or medicines that make you drowsy.  Avoid smoking.  Keep all follow-up visits as told by your health care provider. This is important. Contact a health care provider if:  You keep feeling nauseous or you keep  vomiting.  You feel light-headed.  You are still sleepy or having trouble with balance after 24 hours.  You develop a rash.  You have a fever.  You have redness or swelling around the IV site. Get help right away if:  You have trouble breathing.  You have new-onset confusion at home. Summary  For several hours after your procedure, you may feel tired. You may also be forgetful and have poor judgment.  Have a responsible adult stay with you for the time you are told. It is important to have someone help care for you until you  are awake and alert.  Rest as told. Do not drive or operate machinery. Do not drink alcohol or take sleeping pills.  Get help right away if you have trouble breathing, or if you suddenly become confused. This information is not intended to replace advice given to you by your health care provider. Make sure you discuss any questions you have with your health care provider. Document Revised: 02/20/2020 Document Reviewed: 05/09/2019 Elsevier Patient Education  2021 [employer].

## 2020-09-28 NOTE — Transfer of Care (Signed)
Immediate Anesthesia Transfer of Care Note  Patient: Ryan King  Procedure(s) Performed: COLONOSCOPY WITH PROPOFOL (N/A ) POLYPECTOMY INTESTINAL  Patient Location: Short Stay  Anesthesia Type:General  Level of Consciousness: awake  Airway & Oxygen Therapy: Patient Spontanous Breathing  Post-op Assessment: Report given to RN and Post -op Vital signs reviewed and stable  Post vital signs: Reviewed and stable  Last Vitals:  Vitals Value Taken Time  BP    Temp    Pulse    Resp    SpO2      Last Pain:  Vitals:   09/28/20 0827  TempSrc:   PainSc: 0-No pain      Patients Stated Pain Goal: 5 (86/48/47 2072)  Complications: No complications documented.

## 2020-09-28 NOTE — Anesthesia Postprocedure Evaluation (Signed)
Anesthesia Post Note  Patient: Ryan King  Procedure(s) Performed: COLONOSCOPY WITH PROPOFOL (N/A ) POLYPECTOMY INTESTINAL  Patient location during evaluation: Short Stay Anesthesia Type: General Level of consciousness: awake and alert Pain management: pain level controlled Vital Signs Assessment: post-procedure vital signs reviewed and stable Respiratory status: spontaneous breathing Cardiovascular status: blood pressure returned to baseline and stable Postop Assessment: no apparent nausea or vomiting Anesthetic complications: no   No complications documented.   Last Vitals:  Vitals:   09/28/20 0717 09/28/20 0918  BP: (!) 145/91 98/60  Pulse: 80 65  Resp: 20 18  Temp: 37 C 36.6 C  SpO2: 100% 96%    Last Pain:  Vitals:   09/28/20 0918  TempSrc: Oral  PainSc:                  [name]

## 2020-09-29 ENCOUNTER — Encounter: Payer: Self-pay | Admitting: Internal Medicine

## 2020-09-29 LAB — SURGICAL PATHOLOGY

## 2020-10-01 ENCOUNTER — Encounter (HOSPITAL_COMMUNITY): Payer: Self-pay | Admitting: Internal Medicine

## 2020-10-06 ENCOUNTER — Telehealth: Payer: Self-pay | Admitting: Internal Medicine

## 2020-10-06 NOTE — Telephone Encounter (Signed)
I have faxed colonoscopy, path and letter to Dr Tereasa Coop office.

## 2020-10-06 NOTE — Telephone Encounter (Signed)
Pt called and requested [place] to send records to Dr. Rocco Serene in [place].  He said he already knew his results.  Called their office to confirm fax number.

## 2020-10-06 NOTE — Telephone Encounter (Signed)
Noted.  I did earlier as well.  Thanks.

## 2020-10-06 NOTE — Telephone Encounter (Signed)
Pt had procedure on 4/11 and was asking for his results. [phone]

## 2020-10-07 ENCOUNTER — Telehealth: Payer: Self-pay | Admitting: Internal Medicine

## 2020-10-07 NOTE — Telephone Encounter (Signed)
Pt called in and wanted to know if we were the ones referring him to a lung specialist.  I looked in our last ov note and there was no mention of it.  He said that someone called him about this recently but he wasn't sure who called.  I informed him that it may have been his PCP that did a referral.  He said that he would call his PCP to find out.

## 2020-10-07 NOTE — Telephone Encounter (Signed)
Pt needs to speak to a nurse. [phone]

## 2020-10-07 NOTE — Telephone Encounter (Signed)
Lmom for pt to call me back. 

## 2020-10-08 DIAGNOSIS — K3533 Acute appendicitis with perforation and localized peritonitis, with abscess: Secondary | ICD-10-CM | POA: Diagnosis not present

## 2020-10-08 DIAGNOSIS — Z800 Family history of malignant neoplasm of digestive organs: Secondary | ICD-10-CM | POA: Diagnosis not present

## 2020-10-09 DIAGNOSIS — E113393 Type 2 diabetes mellitus with moderate nonproliferative diabetic retinopathy without macular edema, bilateral: Secondary | ICD-10-CM | POA: Diagnosis not present

## 2020-10-09 DIAGNOSIS — Z7984 Long term (current) use of oral hypoglycemic drugs: Secondary | ICD-10-CM | POA: Diagnosis not present

## 2020-10-09 DIAGNOSIS — H524 Presbyopia: Secondary | ICD-10-CM | POA: Diagnosis not present

## 2020-10-09 DIAGNOSIS — Z961 Presence of intraocular lens: Secondary | ICD-10-CM | POA: Diagnosis not present

## 2020-10-12 DIAGNOSIS — F17210 Nicotine dependence, cigarettes, uncomplicated: Secondary | ICD-10-CM | POA: Diagnosis not present

## 2020-10-13 DIAGNOSIS — K3533 Acute appendicitis with perforation and localized peritonitis, with abscess: Secondary | ICD-10-CM | POA: Diagnosis not present

## 2020-10-15 DIAGNOSIS — M4699 Unspecified inflammatory spondylopathy, multiple sites in spine: Secondary | ICD-10-CM | POA: Diagnosis not present

## 2020-10-15 DIAGNOSIS — K3580 Unspecified acute appendicitis: Secondary | ICD-10-CM | POA: Diagnosis not present

## 2020-10-15 DIAGNOSIS — I700 Atherosclerosis of aorta: Secondary | ICD-10-CM | POA: Diagnosis not present

## 2020-10-15 DIAGNOSIS — K3533 Acute appendicitis with perforation and localized peritonitis, with abscess: Secondary | ICD-10-CM | POA: Diagnosis not present

## 2020-10-15 DIAGNOSIS — Q630 Accessory kidney: Secondary | ICD-10-CM | POA: Diagnosis not present

## 2020-11-02 DIAGNOSIS — E1122 Type 2 diabetes mellitus with diabetic chronic kidney disease: Secondary | ICD-10-CM | POA: Diagnosis not present

## 2020-11-02 DIAGNOSIS — R946 Abnormal results of thyroid function studies: Secondary | ICD-10-CM | POA: Diagnosis not present

## 2020-11-02 DIAGNOSIS — R5383 Other fatigue: Secondary | ICD-10-CM | POA: Diagnosis not present

## 2020-11-02 DIAGNOSIS — E7849 Other hyperlipidemia: Secondary | ICD-10-CM | POA: Diagnosis not present

## 2020-11-02 DIAGNOSIS — E782 Mixed hyperlipidemia: Secondary | ICD-10-CM | POA: Diagnosis not present

## 2020-11-06 DIAGNOSIS — Z01818 Encounter for other preprocedural examination: Secondary | ICD-10-CM | POA: Diagnosis not present

## 2020-11-09 DIAGNOSIS — J449 Chronic obstructive pulmonary disease, unspecified: Secondary | ICD-10-CM | POA: Diagnosis not present

## 2020-11-09 DIAGNOSIS — E1140 Type 2 diabetes mellitus with diabetic neuropathy, unspecified: Secondary | ICD-10-CM | POA: Diagnosis not present

## 2020-11-09 DIAGNOSIS — K36 Other appendicitis: Secondary | ICD-10-CM | POA: Diagnosis not present

## 2020-11-09 DIAGNOSIS — I10 Essential (primary) hypertension: Secondary | ICD-10-CM | POA: Diagnosis not present

## 2020-11-09 DIAGNOSIS — F17210 Nicotine dependence, cigarettes, uncomplicated: Secondary | ICD-10-CM | POA: Diagnosis not present

## 2020-11-09 DIAGNOSIS — K37 Unspecified appendicitis: Secondary | ICD-10-CM | POA: Diagnosis not present

## 2020-11-09 DIAGNOSIS — Z7984 Long term (current) use of oral hypoglycemic drugs: Secondary | ICD-10-CM | POA: Diagnosis not present

## 2020-11-09 DIAGNOSIS — E785 Hyperlipidemia, unspecified: Secondary | ICD-10-CM | POA: Diagnosis not present

## 2020-11-09 DIAGNOSIS — Z800 Family history of malignant neoplasm of digestive organs: Secondary | ICD-10-CM | POA: Diagnosis not present

## 2020-11-09 DIAGNOSIS — K3533 Acute appendicitis with perforation and localized peritonitis, with abscess: Secondary | ICD-10-CM | POA: Diagnosis not present

## 2020-12-03 DIAGNOSIS — F17210 Nicotine dependence, cigarettes, uncomplicated: Secondary | ICD-10-CM | POA: Diagnosis not present

## 2020-12-03 DIAGNOSIS — J439 Emphysema, unspecified: Secondary | ICD-10-CM | POA: Diagnosis not present

## 2020-12-03 DIAGNOSIS — E1140 Type 2 diabetes mellitus with diabetic neuropathy, unspecified: Secondary | ICD-10-CM | POA: Diagnosis not present

## 2020-12-03 DIAGNOSIS — E1122 Type 2 diabetes mellitus with diabetic chronic kidney disease: Secondary | ICD-10-CM | POA: Diagnosis not present

## 2020-12-03 DIAGNOSIS — D126 Benign neoplasm of colon, unspecified: Secondary | ICD-10-CM | POA: Diagnosis not present

## 2020-12-03 DIAGNOSIS — E7849 Other hyperlipidemia: Secondary | ICD-10-CM | POA: Diagnosis not present

## 2021-03-16 DIAGNOSIS — E1122 Type 2 diabetes mellitus with diabetic chronic kidney disease: Secondary | ICD-10-CM | POA: Diagnosis not present

## 2021-03-16 DIAGNOSIS — E7849 Other hyperlipidemia: Secondary | ICD-10-CM | POA: Diagnosis not present

## 2021-03-16 DIAGNOSIS — R5383 Other fatigue: Secondary | ICD-10-CM | POA: Diagnosis not present

## 2021-03-16 DIAGNOSIS — E782 Mixed hyperlipidemia: Secondary | ICD-10-CM | POA: Diagnosis not present

## 2021-03-18 DIAGNOSIS — D126 Benign neoplasm of colon, unspecified: Secondary | ICD-10-CM | POA: Diagnosis not present

## 2021-03-18 DIAGNOSIS — E7849 Other hyperlipidemia: Secondary | ICD-10-CM | POA: Diagnosis not present

## 2021-03-18 DIAGNOSIS — J439 Emphysema, unspecified: Secondary | ICD-10-CM | POA: Diagnosis not present

## 2021-03-18 DIAGNOSIS — I700 Atherosclerosis of aorta: Secondary | ICD-10-CM | POA: Diagnosis not present

## 2021-03-18 DIAGNOSIS — E1122 Type 2 diabetes mellitus with diabetic chronic kidney disease: Secondary | ICD-10-CM | POA: Diagnosis not present

## 2021-03-18 DIAGNOSIS — E1140 Type 2 diabetes mellitus with diabetic neuropathy, unspecified: Secondary | ICD-10-CM | POA: Diagnosis not present

## 2021-06-16 DIAGNOSIS — R946 Abnormal results of thyroid function studies: Secondary | ICD-10-CM | POA: Diagnosis not present

## 2021-06-16 DIAGNOSIS — E1122 Type 2 diabetes mellitus with diabetic chronic kidney disease: Secondary | ICD-10-CM | POA: Diagnosis not present

## 2021-06-16 DIAGNOSIS — R5383 Other fatigue: Secondary | ICD-10-CM | POA: Diagnosis not present

## 2021-06-16 DIAGNOSIS — E1140 Type 2 diabetes mellitus with diabetic neuropathy, unspecified: Secondary | ICD-10-CM | POA: Diagnosis not present

## 2021-06-16 DIAGNOSIS — E559 Vitamin D deficiency, unspecified: Secondary | ICD-10-CM | POA: Diagnosis not present

## 2021-06-16 DIAGNOSIS — E7849 Other hyperlipidemia: Secondary | ICD-10-CM | POA: Diagnosis not present

## 2021-06-16 DIAGNOSIS — E1165 Type 2 diabetes mellitus with hyperglycemia: Secondary | ICD-10-CM | POA: Diagnosis not present

## 2021-06-18 DIAGNOSIS — E782 Mixed hyperlipidemia: Secondary | ICD-10-CM | POA: Diagnosis not present

## 2021-06-18 DIAGNOSIS — E1165 Type 2 diabetes mellitus with hyperglycemia: Secondary | ICD-10-CM | POA: Diagnosis not present

## 2021-06-23 DIAGNOSIS — E1122 Type 2 diabetes mellitus with diabetic chronic kidney disease: Secondary | ICD-10-CM | POA: Diagnosis not present

## 2021-06-23 DIAGNOSIS — I700 Atherosclerosis of aorta: Secondary | ICD-10-CM | POA: Diagnosis not present

## 2021-06-23 DIAGNOSIS — J439 Emphysema, unspecified: Secondary | ICD-10-CM | POA: Diagnosis not present

## 2021-06-23 DIAGNOSIS — E1140 Type 2 diabetes mellitus with diabetic neuropathy, unspecified: Secondary | ICD-10-CM | POA: Diagnosis not present

## 2021-06-23 DIAGNOSIS — F17210 Nicotine dependence, cigarettes, uncomplicated: Secondary | ICD-10-CM | POA: Diagnosis not present

## 2021-06-23 DIAGNOSIS — E559 Vitamin D deficiency, unspecified: Secondary | ICD-10-CM | POA: Diagnosis not present

## 2021-06-23 DIAGNOSIS — E7849 Other hyperlipidemia: Secondary | ICD-10-CM | POA: Diagnosis not present

## 2021-06-25 DIAGNOSIS — Z136 Encounter for screening for cardiovascular disorders: Secondary | ICD-10-CM | POA: Diagnosis not present

## 2021-06-25 DIAGNOSIS — Z87891 Personal history of nicotine dependence: Secondary | ICD-10-CM | POA: Diagnosis not present

## 2021-09-17 DIAGNOSIS — E782 Mixed hyperlipidemia: Secondary | ICD-10-CM | POA: Diagnosis not present

## 2021-09-17 DIAGNOSIS — E1165 Type 2 diabetes mellitus with hyperglycemia: Secondary | ICD-10-CM | POA: Diagnosis not present

## 2021-10-12 DIAGNOSIS — F17210 Nicotine dependence, cigarettes, uncomplicated: Secondary | ICD-10-CM | POA: Diagnosis not present

## 2021-10-12 DIAGNOSIS — E782 Mixed hyperlipidemia: Secondary | ICD-10-CM | POA: Diagnosis not present

## 2021-10-12 DIAGNOSIS — I2510 Atherosclerotic heart disease of native coronary artery without angina pectoris: Secondary | ICD-10-CM | POA: Diagnosis not present

## 2021-10-12 DIAGNOSIS — E559 Vitamin D deficiency, unspecified: Secondary | ICD-10-CM | POA: Diagnosis not present

## 2021-10-12 DIAGNOSIS — R5383 Other fatigue: Secondary | ICD-10-CM | POA: Diagnosis not present

## 2021-10-12 DIAGNOSIS — Z122 Encounter for screening for malignant neoplasm of respiratory organs: Secondary | ICD-10-CM | POA: Diagnosis not present

## 2021-10-12 DIAGNOSIS — E1122 Type 2 diabetes mellitus with diabetic chronic kidney disease: Secondary | ICD-10-CM | POA: Diagnosis not present

## 2021-10-12 DIAGNOSIS — I700 Atherosclerosis of aorta: Secondary | ICD-10-CM | POA: Diagnosis not present

## 2021-10-12 DIAGNOSIS — E7849 Other hyperlipidemia: Secondary | ICD-10-CM | POA: Diagnosis not present

## 2021-10-15 DIAGNOSIS — Z0001 Encounter for general adult medical examination with abnormal findings: Secondary | ICD-10-CM | POA: Diagnosis not present

## 2021-10-15 DIAGNOSIS — E559 Vitamin D deficiency, unspecified: Secondary | ICD-10-CM | POA: Diagnosis not present

## 2021-10-15 DIAGNOSIS — E1122 Type 2 diabetes mellitus with diabetic chronic kidney disease: Secondary | ICD-10-CM | POA: Diagnosis not present

## 2021-10-15 DIAGNOSIS — F17210 Nicotine dependence, cigarettes, uncomplicated: Secondary | ICD-10-CM | POA: Diagnosis not present

## 2021-10-15 DIAGNOSIS — I700 Atherosclerosis of aorta: Secondary | ICD-10-CM | POA: Diagnosis not present

## 2021-10-15 DIAGNOSIS — J439 Emphysema, unspecified: Secondary | ICD-10-CM | POA: Diagnosis not present

## 2021-10-15 DIAGNOSIS — E1140 Type 2 diabetes mellitus with diabetic neuropathy, unspecified: Secondary | ICD-10-CM | POA: Diagnosis not present

## 2022-01-05 DIAGNOSIS — E1140 Type 2 diabetes mellitus with diabetic neuropathy, unspecified: Secondary | ICD-10-CM | POA: Diagnosis not present

## 2022-01-05 DIAGNOSIS — R946 Abnormal results of thyroid function studies: Secondary | ICD-10-CM | POA: Diagnosis not present

## 2022-01-05 DIAGNOSIS — J439 Emphysema, unspecified: Secondary | ICD-10-CM | POA: Diagnosis not present

## 2022-01-05 DIAGNOSIS — E1165 Type 2 diabetes mellitus with hyperglycemia: Secondary | ICD-10-CM | POA: Diagnosis not present

## 2022-01-05 DIAGNOSIS — E7849 Other hyperlipidemia: Secondary | ICD-10-CM | POA: Diagnosis not present

## 2022-01-05 DIAGNOSIS — E1122 Type 2 diabetes mellitus with diabetic chronic kidney disease: Secondary | ICD-10-CM | POA: Diagnosis not present

## 2022-01-05 DIAGNOSIS — E782 Mixed hyperlipidemia: Secondary | ICD-10-CM | POA: Diagnosis not present

## 2022-03-18 DIAGNOSIS — E1165 Type 2 diabetes mellitus with hyperglycemia: Secondary | ICD-10-CM | POA: Diagnosis not present

## 2022-03-19 DIAGNOSIS — E1165 Type 2 diabetes mellitus with hyperglycemia: Secondary | ICD-10-CM | POA: Diagnosis not present

## 2022-03-19 DIAGNOSIS — E782 Mixed hyperlipidemia: Secondary | ICD-10-CM | POA: Diagnosis not present

## 2022-03-23 DIAGNOSIS — F17210 Nicotine dependence, cigarettes, uncomplicated: Secondary | ICD-10-CM | POA: Diagnosis not present

## 2022-03-23 DIAGNOSIS — R030 Elevated blood-pressure reading, without diagnosis of hypertension: Secondary | ICD-10-CM | POA: Diagnosis not present

## 2022-03-23 DIAGNOSIS — Z6822 Body mass index (BMI) 22.0-22.9, adult: Secondary | ICD-10-CM | POA: Diagnosis not present

## 2022-03-23 DIAGNOSIS — E1122 Type 2 diabetes mellitus with diabetic chronic kidney disease: Secondary | ICD-10-CM | POA: Diagnosis not present

## 2022-04-25 DIAGNOSIS — Z20822 Contact with and (suspected) exposure to covid-19: Secondary | ICD-10-CM | POA: Diagnosis not present

## 2022-06-19 DIAGNOSIS — E1165 Type 2 diabetes mellitus with hyperglycemia: Secondary | ICD-10-CM | POA: Diagnosis not present

## 2022-06-19 DIAGNOSIS — E782 Mixed hyperlipidemia: Secondary | ICD-10-CM | POA: Diagnosis not present

## 2022-08-17 DIAGNOSIS — E1122 Type 2 diabetes mellitus with diabetic chronic kidney disease: Secondary | ICD-10-CM | POA: Diagnosis not present

## 2022-08-17 DIAGNOSIS — E039 Hypothyroidism, unspecified: Secondary | ICD-10-CM | POA: Diagnosis not present

## 2022-08-17 DIAGNOSIS — I700 Atherosclerosis of aorta: Secondary | ICD-10-CM | POA: Diagnosis not present

## 2022-08-17 DIAGNOSIS — F17210 Nicotine dependence, cigarettes, uncomplicated: Secondary | ICD-10-CM | POA: Diagnosis not present

## 2022-08-17 DIAGNOSIS — E7849 Other hyperlipidemia: Secondary | ICD-10-CM | POA: Diagnosis not present

## 2022-08-17 DIAGNOSIS — J439 Emphysema, unspecified: Secondary | ICD-10-CM | POA: Diagnosis not present

## 2022-08-17 DIAGNOSIS — D72829 Elevated white blood cell count, unspecified: Secondary | ICD-10-CM | POA: Diagnosis not present

## 2022-08-17 DIAGNOSIS — R030 Elevated blood-pressure reading, without diagnosis of hypertension: Secondary | ICD-10-CM | POA: Diagnosis not present

## 2022-09-13 DIAGNOSIS — E1122 Type 2 diabetes mellitus with diabetic chronic kidney disease: Secondary | ICD-10-CM | POA: Diagnosis not present

## 2022-09-13 DIAGNOSIS — E1140 Type 2 diabetes mellitus with diabetic neuropathy, unspecified: Secondary | ICD-10-CM | POA: Diagnosis not present

## 2022-12-28 DIAGNOSIS — E7849 Other hyperlipidemia: Secondary | ICD-10-CM | POA: Diagnosis not present

## 2022-12-28 DIAGNOSIS — E1165 Type 2 diabetes mellitus with hyperglycemia: Secondary | ICD-10-CM | POA: Diagnosis not present

## 2022-12-28 DIAGNOSIS — R5383 Other fatigue: Secondary | ICD-10-CM | POA: Diagnosis not present

## 2022-12-28 DIAGNOSIS — E1122 Type 2 diabetes mellitus with diabetic chronic kidney disease: Secondary | ICD-10-CM | POA: Diagnosis not present

## 2023-01-06 DIAGNOSIS — N1832 Chronic kidney disease, stage 3b: Secondary | ICD-10-CM | POA: Diagnosis not present

## 2023-01-06 DIAGNOSIS — R296 Repeated falls: Secondary | ICD-10-CM | POA: Diagnosis not present

## 2023-01-06 DIAGNOSIS — E7849 Other hyperlipidemia: Secondary | ICD-10-CM | POA: Diagnosis not present

## 2023-01-06 DIAGNOSIS — E1122 Type 2 diabetes mellitus with diabetic chronic kidney disease: Secondary | ICD-10-CM | POA: Diagnosis not present

## 2023-01-06 DIAGNOSIS — F17210 Nicotine dependence, cigarettes, uncomplicated: Secondary | ICD-10-CM | POA: Diagnosis not present

## 2023-01-06 DIAGNOSIS — L089 Local infection of the skin and subcutaneous tissue, unspecified: Secondary | ICD-10-CM | POA: Diagnosis not present

## 2023-01-06 DIAGNOSIS — S80819A Abrasion, unspecified lower leg, initial encounter: Secondary | ICD-10-CM | POA: Diagnosis not present

## 2023-01-06 DIAGNOSIS — R030 Elevated blood-pressure reading, without diagnosis of hypertension: Secondary | ICD-10-CM | POA: Diagnosis not present

## 2023-01-06 DIAGNOSIS — Z0001 Encounter for general adult medical examination with abnormal findings: Secondary | ICD-10-CM | POA: Diagnosis not present

## 2023-01-06 DIAGNOSIS — I700 Atherosclerosis of aorta: Secondary | ICD-10-CM | POA: Diagnosis not present

## 2023-01-06 DIAGNOSIS — J439 Emphysema, unspecified: Secondary | ICD-10-CM | POA: Diagnosis not present

## 2023-01-06 DIAGNOSIS — E1140 Type 2 diabetes mellitus with diabetic neuropathy, unspecified: Secondary | ICD-10-CM | POA: Diagnosis not present

## 2023-01-24 DIAGNOSIS — F17210 Nicotine dependence, cigarettes, uncomplicated: Secondary | ICD-10-CM | POA: Diagnosis not present

## 2023-01-24 DIAGNOSIS — I739 Peripheral vascular disease, unspecified: Secondary | ICD-10-CM | POA: Diagnosis not present

## 2023-01-24 DIAGNOSIS — I70213 Atherosclerosis of native arteries of extremities with intermittent claudication, bilateral legs: Secondary | ICD-10-CM | POA: Diagnosis not present

## 2023-01-24 DIAGNOSIS — Z122 Encounter for screening for malignant neoplasm of respiratory organs: Secondary | ICD-10-CM | POA: Diagnosis not present

## 2023-01-24 DIAGNOSIS — I700 Atherosclerosis of aorta: Secondary | ICD-10-CM | POA: Diagnosis not present

## 2023-01-24 DIAGNOSIS — J432 Centrilobular emphysema: Secondary | ICD-10-CM | POA: Diagnosis not present

## 2023-01-24 DIAGNOSIS — J438 Other emphysema: Secondary | ICD-10-CM | POA: Diagnosis not present

## 2023-02-09 DIAGNOSIS — N1832 Chronic kidney disease, stage 3b: Secondary | ICD-10-CM | POA: Diagnosis not present

## 2023-02-09 DIAGNOSIS — E7849 Other hyperlipidemia: Secondary | ICD-10-CM | POA: Diagnosis not present

## 2023-02-09 DIAGNOSIS — Z0001 Encounter for general adult medical examination with abnormal findings: Secondary | ICD-10-CM | POA: Diagnosis not present

## 2023-02-09 DIAGNOSIS — E1165 Type 2 diabetes mellitus with hyperglycemia: Secondary | ICD-10-CM | POA: Diagnosis not present

## 2023-02-09 DIAGNOSIS — E1122 Type 2 diabetes mellitus with diabetic chronic kidney disease: Secondary | ICD-10-CM | POA: Diagnosis not present

## 2023-02-09 DIAGNOSIS — Z1329 Encounter for screening for other suspected endocrine disorder: Secondary | ICD-10-CM | POA: Diagnosis not present

## 2023-02-15 DIAGNOSIS — R030 Elevated blood-pressure reading, without diagnosis of hypertension: Secondary | ICD-10-CM | POA: Diagnosis not present

## 2023-02-15 DIAGNOSIS — I700 Atherosclerosis of aorta: Secondary | ICD-10-CM | POA: Diagnosis not present

## 2023-02-15 DIAGNOSIS — E1122 Type 2 diabetes mellitus with diabetic chronic kidney disease: Secondary | ICD-10-CM | POA: Diagnosis not present

## 2023-02-15 DIAGNOSIS — N1832 Chronic kidney disease, stage 3b: Secondary | ICD-10-CM | POA: Diagnosis not present

## 2023-02-15 DIAGNOSIS — Z6822 Body mass index (BMI) 22.0-22.9, adult: Secondary | ICD-10-CM | POA: Diagnosis not present

## 2023-02-15 DIAGNOSIS — J439 Emphysema, unspecified: Secondary | ICD-10-CM | POA: Diagnosis not present

## 2023-02-15 DIAGNOSIS — F17210 Nicotine dependence, cigarettes, uncomplicated: Secondary | ICD-10-CM | POA: Diagnosis not present

## 2023-02-15 DIAGNOSIS — I739 Peripheral vascular disease, unspecified: Secondary | ICD-10-CM | POA: Diagnosis not present

## 2023-02-15 DIAGNOSIS — E782 Mixed hyperlipidemia: Secondary | ICD-10-CM | POA: Diagnosis not present

## 2023-02-15 DIAGNOSIS — E1140 Type 2 diabetes mellitus with diabetic neuropathy, unspecified: Secondary | ICD-10-CM | POA: Diagnosis not present

## 2023-05-08 DIAGNOSIS — E1165 Type 2 diabetes mellitus with hyperglycemia: Secondary | ICD-10-CM | POA: Diagnosis not present

## 2023-05-08 DIAGNOSIS — E1122 Type 2 diabetes mellitus with diabetic chronic kidney disease: Secondary | ICD-10-CM | POA: Diagnosis not present

## 2023-05-08 DIAGNOSIS — E7849 Other hyperlipidemia: Secondary | ICD-10-CM | POA: Diagnosis not present

## 2023-05-08 DIAGNOSIS — N1832 Chronic kidney disease, stage 3b: Secondary | ICD-10-CM | POA: Diagnosis not present

## 2023-05-15 DIAGNOSIS — Z6823 Body mass index (BMI) 23.0-23.9, adult: Secondary | ICD-10-CM | POA: Diagnosis not present

## 2023-05-15 DIAGNOSIS — D126 Benign neoplasm of colon, unspecified: Secondary | ICD-10-CM | POA: Diagnosis not present

## 2023-05-15 DIAGNOSIS — F17210 Nicotine dependence, cigarettes, uncomplicated: Secondary | ICD-10-CM | POA: Diagnosis not present

## 2023-05-15 DIAGNOSIS — Z800 Family history of malignant neoplasm of digestive organs: Secondary | ICD-10-CM | POA: Diagnosis not present

## 2023-05-15 DIAGNOSIS — E1122 Type 2 diabetes mellitus with diabetic chronic kidney disease: Secondary | ICD-10-CM | POA: Diagnosis not present

## 2023-05-15 DIAGNOSIS — R030 Elevated blood-pressure reading, without diagnosis of hypertension: Secondary | ICD-10-CM | POA: Diagnosis not present

## 2023-05-15 DIAGNOSIS — E1140 Type 2 diabetes mellitus with diabetic neuropathy, unspecified: Secondary | ICD-10-CM | POA: Diagnosis not present

## 2023-05-15 DIAGNOSIS — E7849 Other hyperlipidemia: Secondary | ICD-10-CM | POA: Diagnosis not present

## 2023-05-15 DIAGNOSIS — N184 Chronic kidney disease, stage 4 (severe): Secondary | ICD-10-CM | POA: Diagnosis not present

## 2023-05-15 DIAGNOSIS — J439 Emphysema, unspecified: Secondary | ICD-10-CM | POA: Diagnosis not present

## 2023-05-15 DIAGNOSIS — I739 Peripheral vascular disease, unspecified: Secondary | ICD-10-CM | POA: Diagnosis not present

## 2023-05-26 DIAGNOSIS — F17210 Nicotine dependence, cigarettes, uncomplicated: Secondary | ICD-10-CM | POA: Diagnosis not present

## 2023-05-26 DIAGNOSIS — S0990XA Unspecified injury of head, initial encounter: Secondary | ICD-10-CM | POA: Diagnosis not present

## 2023-05-26 DIAGNOSIS — E7800 Pure hypercholesterolemia, unspecified: Secondary | ICD-10-CM | POA: Diagnosis not present

## 2023-05-26 DIAGNOSIS — E1140 Type 2 diabetes mellitus with diabetic neuropathy, unspecified: Secondary | ICD-10-CM | POA: Diagnosis not present

## 2023-05-26 DIAGNOSIS — M25562 Pain in left knee: Secondary | ICD-10-CM | POA: Diagnosis not present

## 2023-05-26 DIAGNOSIS — M1712 Unilateral primary osteoarthritis, left knee: Secondary | ICD-10-CM | POA: Diagnosis not present

## 2023-05-26 DIAGNOSIS — Z7984 Long term (current) use of oral hypoglycemic drugs: Secondary | ICD-10-CM | POA: Diagnosis not present

## 2023-05-26 DIAGNOSIS — Z79899 Other long term (current) drug therapy: Secondary | ICD-10-CM | POA: Diagnosis not present

## 2023-05-26 DIAGNOSIS — I672 Cerebral atherosclerosis: Secondary | ICD-10-CM | POA: Diagnosis not present

## 2023-05-26 DIAGNOSIS — I6782 Cerebral ischemia: Secondary | ICD-10-CM | POA: Diagnosis not present

## 2023-05-26 DIAGNOSIS — W228XXA Striking against or struck by other objects, initial encounter: Secondary | ICD-10-CM | POA: Diagnosis not present

## 2023-05-26 DIAGNOSIS — J439 Emphysema, unspecified: Secondary | ICD-10-CM | POA: Diagnosis not present

## 2023-05-29 DIAGNOSIS — F17210 Nicotine dependence, cigarettes, uncomplicated: Secondary | ICD-10-CM | POA: Diagnosis not present

## 2023-05-29 DIAGNOSIS — R350 Frequency of micturition: Secondary | ICD-10-CM | POA: Diagnosis not present

## 2023-05-29 DIAGNOSIS — W0110XA Fall on same level from slipping, tripping and stumbling with subsequent striking against unspecified object, initial encounter: Secondary | ICD-10-CM | POA: Diagnosis not present

## 2023-05-29 DIAGNOSIS — S0990XA Unspecified injury of head, initial encounter: Secondary | ICD-10-CM | POA: Diagnosis not present

## 2023-05-29 DIAGNOSIS — E1165 Type 2 diabetes mellitus with hyperglycemia: Secondary | ICD-10-CM | POA: Diagnosis not present

## 2023-05-29 DIAGNOSIS — E7800 Pure hypercholesterolemia, unspecified: Secondary | ICD-10-CM | POA: Diagnosis not present

## 2023-05-29 DIAGNOSIS — M25562 Pain in left knee: Secondary | ICD-10-CM | POA: Diagnosis not present

## 2023-05-29 DIAGNOSIS — J439 Emphysema, unspecified: Secondary | ICD-10-CM | POA: Diagnosis not present

## 2023-05-29 DIAGNOSIS — E1140 Type 2 diabetes mellitus with diabetic neuropathy, unspecified: Secondary | ICD-10-CM | POA: Diagnosis not present

## 2023-05-29 DIAGNOSIS — Z7984 Long term (current) use of oral hypoglycemic drugs: Secondary | ICD-10-CM | POA: Diagnosis not present

## 2023-05-29 DIAGNOSIS — Z79899 Other long term (current) drug therapy: Secondary | ICD-10-CM | POA: Diagnosis not present

## 2023-05-29 DIAGNOSIS — R040 Epistaxis: Secondary | ICD-10-CM | POA: Diagnosis not present

## 2023-06-06 DIAGNOSIS — Z720 Tobacco use: Secondary | ICD-10-CM | POA: Diagnosis not present

## 2023-06-06 DIAGNOSIS — N179 Acute kidney failure, unspecified: Secondary | ICD-10-CM | POA: Diagnosis not present

## 2023-06-06 DIAGNOSIS — N1832 Chronic kidney disease, stage 3b: Secondary | ICD-10-CM | POA: Diagnosis not present

## 2023-06-06 DIAGNOSIS — E1122 Type 2 diabetes mellitus with diabetic chronic kidney disease: Secondary | ICD-10-CM | POA: Diagnosis not present

## 2023-06-09 ENCOUNTER — Other Ambulatory Visit (HOSPITAL_COMMUNITY): Payer: Self-pay | Admitting: Nephrology

## 2023-06-09 DIAGNOSIS — N1832 Chronic kidney disease, stage 3b: Secondary | ICD-10-CM

## 2023-06-19 ENCOUNTER — Ambulatory Visit (HOSPITAL_COMMUNITY)
Admission: RE | Admit: 2023-06-19 | Discharge: 2023-06-19 | Disposition: A | Discharge status: Home / Self Care | Payer: 59 | Source: Ambulatory Visit | Attending: Nephrology | Admitting: Nephrology

## 2023-06-19 DIAGNOSIS — N1832 Chronic kidney disease, stage 3b: Secondary | ICD-10-CM | POA: Diagnosis not present

## 2023-06-19 DIAGNOSIS — N189 Chronic kidney disease, unspecified: Secondary | ICD-10-CM | POA: Diagnosis not present

## 2023-07-01 DIAGNOSIS — I6782 Cerebral ischemia: Secondary | ICD-10-CM | POA: Diagnosis not present

## 2023-07-01 DIAGNOSIS — R9082 White matter disease, unspecified: Secondary | ICD-10-CM | POA: Diagnosis not present

## 2023-07-01 DIAGNOSIS — E119 Type 2 diabetes mellitus without complications: Secondary | ICD-10-CM | POA: Diagnosis not present

## 2023-07-01 DIAGNOSIS — G936 Cerebral edema: Secondary | ICD-10-CM | POA: Diagnosis not present

## 2023-07-01 DIAGNOSIS — Z720 Tobacco use: Secondary | ICD-10-CM | POA: Diagnosis not present

## 2023-07-01 DIAGNOSIS — J439 Emphysema, unspecified: Secondary | ICD-10-CM | POA: Diagnosis not present

## 2023-07-01 DIAGNOSIS — H748X3 Other specified disorders of middle ear and mastoid, bilateral: Secondary | ICD-10-CM | POA: Diagnosis not present

## 2023-07-01 DIAGNOSIS — J441 Chronic obstructive pulmonary disease with (acute) exacerbation: Secondary | ICD-10-CM | POA: Diagnosis not present

## 2023-07-01 DIAGNOSIS — J9601 Acute respiratory failure with hypoxia: Secondary | ICD-10-CM | POA: Diagnosis not present

## 2023-07-01 DIAGNOSIS — R918 Other nonspecific abnormal finding of lung field: Secondary | ICD-10-CM | POA: Diagnosis not present

## 2023-07-01 DIAGNOSIS — D649 Anemia, unspecified: Secondary | ICD-10-CM | POA: Diagnosis not present

## 2023-07-01 DIAGNOSIS — I639 Cerebral infarction, unspecified: Secondary | ICD-10-CM | POA: Diagnosis not present

## 2023-07-01 DIAGNOSIS — R739 Hyperglycemia, unspecified: Secondary | ICD-10-CM | POA: Diagnosis not present

## 2023-07-01 DIAGNOSIS — Z743 Need for continuous supervision: Secondary | ICD-10-CM | POA: Diagnosis not present

## 2023-07-01 DIAGNOSIS — I651 Occlusion and stenosis of basilar artery: Secondary | ICD-10-CM | POA: Diagnosis not present

## 2023-07-01 DIAGNOSIS — M7989 Other specified soft tissue disorders: Secondary | ICD-10-CM | POA: Diagnosis not present

## 2023-07-01 DIAGNOSIS — I2510 Atherosclerotic heart disease of native coronary artery without angina pectoris: Secondary | ICD-10-CM | POA: Diagnosis not present

## 2023-07-01 DIAGNOSIS — I739 Peripheral vascular disease, unspecified: Secondary | ICD-10-CM | POA: Diagnosis not present

## 2023-07-01 DIAGNOSIS — J90 Pleural effusion, not elsewhere classified: Secondary | ICD-10-CM | POA: Diagnosis not present

## 2023-07-01 DIAGNOSIS — I6789 Other cerebrovascular disease: Secondary | ICD-10-CM | POA: Diagnosis not present

## 2023-07-01 DIAGNOSIS — R569 Unspecified convulsions: Secondary | ICD-10-CM | POA: Diagnosis not present

## 2023-07-01 DIAGNOSIS — R404 Transient alteration of awareness: Secondary | ICD-10-CM | POA: Diagnosis not present

## 2023-07-01 DIAGNOSIS — G319 Degenerative disease of nervous system, unspecified: Secondary | ICD-10-CM | POA: Diagnosis not present

## 2023-07-01 DIAGNOSIS — N281 Cyst of kidney, acquired: Secondary | ICD-10-CM | POA: Diagnosis not present

## 2023-07-01 DIAGNOSIS — R000 Tachycardia, unspecified: Secondary | ICD-10-CM | POA: Diagnosis not present

## 2023-07-01 DIAGNOSIS — E7800 Pure hypercholesterolemia, unspecified: Secondary | ICD-10-CM | POA: Diagnosis not present

## 2023-07-01 DIAGNOSIS — I249 Acute ischemic heart disease, unspecified: Secondary | ICD-10-CM | POA: Diagnosis not present

## 2023-07-01 DIAGNOSIS — I82431 Acute embolism and thrombosis of right popliteal vein: Secondary | ICD-10-CM | POA: Diagnosis not present

## 2023-07-01 DIAGNOSIS — Z978 Presence of other specified devices: Secondary | ICD-10-CM | POA: Diagnosis not present

## 2023-07-01 DIAGNOSIS — E1165 Type 2 diabetes mellitus with hyperglycemia: Secondary | ICD-10-CM | POA: Diagnosis not present

## 2023-07-01 DIAGNOSIS — R1310 Dysphagia, unspecified: Secondary | ICD-10-CM | POA: Diagnosis not present

## 2023-07-01 DIAGNOSIS — I10 Essential (primary) hypertension: Secondary | ICD-10-CM | POA: Diagnosis not present

## 2023-07-01 DIAGNOSIS — I63331 Cerebral infarction due to thrombosis of right posterior cerebral artery: Secondary | ICD-10-CM | POA: Diagnosis not present

## 2023-07-01 DIAGNOSIS — Z48813 Encounter for surgical aftercare following surgery on the respiratory system: Secondary | ICD-10-CM | POA: Diagnosis not present

## 2023-07-01 DIAGNOSIS — J9690 Respiratory failure, unspecified, unspecified whether with hypoxia or hypercapnia: Secondary | ICD-10-CM | POA: Diagnosis not present

## 2023-07-01 DIAGNOSIS — R509 Fever, unspecified: Secondary | ICD-10-CM | POA: Diagnosis not present

## 2023-07-01 DIAGNOSIS — G928 Other toxic encephalopathy: Secondary | ICD-10-CM | POA: Diagnosis not present

## 2023-07-01 DIAGNOSIS — G8101 Flaccid hemiplegia affecting right dominant side: Secondary | ICD-10-CM | POA: Diagnosis not present

## 2023-07-01 DIAGNOSIS — N179 Acute kidney failure, unspecified: Secondary | ICD-10-CM | POA: Diagnosis not present

## 2023-07-01 DIAGNOSIS — Z681 Body mass index (BMI) 19 or less, adult: Secondary | ICD-10-CM | POA: Diagnosis not present

## 2023-07-01 DIAGNOSIS — I6501 Occlusion and stenosis of right vertebral artery: Secondary | ICD-10-CM | POA: Diagnosis not present

## 2023-07-01 DIAGNOSIS — R0689 Other abnormalities of breathing: Secondary | ICD-10-CM | POA: Diagnosis not present

## 2023-07-01 DIAGNOSIS — R531 Weakness: Secondary | ICD-10-CM | POA: Diagnosis not present

## 2023-07-01 DIAGNOSIS — N189 Chronic kidney disease, unspecified: Secondary | ICD-10-CM | POA: Diagnosis not present

## 2023-07-01 DIAGNOSIS — I96 Gangrene, not elsewhere classified: Secondary | ICD-10-CM | POA: Diagnosis not present

## 2023-07-01 DIAGNOSIS — D62 Acute posthemorrhagic anemia: Secondary | ICD-10-CM | POA: Diagnosis not present

## 2023-07-01 DIAGNOSIS — K625 Hemorrhage of anus and rectum: Secondary | ICD-10-CM | POA: Diagnosis not present

## 2023-07-01 DIAGNOSIS — R579 Shock, unspecified: Secondary | ICD-10-CM | POA: Diagnosis not present

## 2023-07-01 DIAGNOSIS — J9811 Atelectasis: Secondary | ICD-10-CM | POA: Diagnosis not present

## 2023-07-01 DIAGNOSIS — G9340 Encephalopathy, unspecified: Secondary | ICD-10-CM | POA: Diagnosis not present

## 2023-07-01 DIAGNOSIS — L97919 Non-pressure chronic ulcer of unspecified part of right lower leg with unspecified severity: Secondary | ICD-10-CM | POA: Diagnosis not present

## 2023-07-01 DIAGNOSIS — E1140 Type 2 diabetes mellitus with diabetic neuropathy, unspecified: Secondary | ICD-10-CM | POA: Diagnosis not present

## 2023-07-01 DIAGNOSIS — I70261 Atherosclerosis of native arteries of extremities with gangrene, right leg: Secondary | ICD-10-CM | POA: Diagnosis not present

## 2023-07-01 DIAGNOSIS — I82411 Acute embolism and thrombosis of right femoral vein: Secondary | ICD-10-CM | POA: Diagnosis not present

## 2023-07-01 DIAGNOSIS — I6523 Occlusion and stenosis of bilateral carotid arteries: Secondary | ICD-10-CM | POA: Diagnosis not present

## 2023-07-01 DIAGNOSIS — Z4682 Encounter for fitting and adjustment of non-vascular catheter: Secondary | ICD-10-CM | POA: Diagnosis not present

## 2023-07-01 DIAGNOSIS — E1152 Type 2 diabetes mellitus with diabetic peripheral angiopathy with gangrene: Secondary | ICD-10-CM | POA: Diagnosis not present

## 2023-07-01 DIAGNOSIS — J1282 Pneumonia due to coronavirus disease 2019: Secondary | ICD-10-CM | POA: Diagnosis not present

## 2023-07-01 DIAGNOSIS — K5730 Diverticulosis of large intestine without perforation or abscess without bleeding: Secondary | ICD-10-CM | POA: Diagnosis not present

## 2023-07-01 DIAGNOSIS — E1111 Type 2 diabetes mellitus with ketoacidosis with coma: Secondary | ICD-10-CM | POA: Diagnosis not present

## 2023-07-01 DIAGNOSIS — T17998A Other foreign object in respiratory tract, part unspecified causing other injury, initial encounter: Secondary | ICD-10-CM | POA: Diagnosis not present

## 2023-07-01 DIAGNOSIS — I129 Hypertensive chronic kidney disease with stage 1 through stage 4 chronic kidney disease, or unspecified chronic kidney disease: Secondary | ICD-10-CM | POA: Diagnosis not present

## 2023-07-01 DIAGNOSIS — R2990 Unspecified symptoms and signs involving the nervous system: Secondary | ICD-10-CM | POA: Diagnosis not present

## 2023-07-01 DIAGNOSIS — R160 Hepatomegaly, not elsewhere classified: Secondary | ICD-10-CM | POA: Diagnosis not present

## 2023-07-01 DIAGNOSIS — R9431 Abnormal electrocardiogram [ECG] [EKG]: Secondary | ICD-10-CM | POA: Diagnosis not present

## 2023-07-01 DIAGNOSIS — U071 COVID-19: Secondary | ICD-10-CM | POA: Diagnosis not present

## 2023-07-01 DIAGNOSIS — R0603 Acute respiratory distress: Secondary | ICD-10-CM | POA: Diagnosis not present

## 2023-07-01 DIAGNOSIS — R627 Adult failure to thrive: Secondary | ICD-10-CM | POA: Diagnosis not present

## 2023-07-01 DIAGNOSIS — R4701 Aphasia: Secondary | ICD-10-CM | POA: Diagnosis not present

## 2023-07-01 DIAGNOSIS — E870 Hyperosmolality and hypernatremia: Secondary | ICD-10-CM | POA: Diagnosis not present

## 2023-07-01 DIAGNOSIS — I6522 Occlusion and stenosis of left carotid artery: Secondary | ICD-10-CM | POA: Diagnosis not present

## 2023-07-01 DIAGNOSIS — Z515 Encounter for palliative care: Secondary | ICD-10-CM | POA: Diagnosis not present

## 2023-07-01 DIAGNOSIS — N170 Acute kidney failure with tubular necrosis: Secondary | ICD-10-CM | POA: Diagnosis not present

## 2023-07-01 DIAGNOSIS — I700 Atherosclerosis of aorta: Secondary | ICD-10-CM | POA: Diagnosis not present

## 2023-07-01 DIAGNOSIS — R1312 Dysphagia, oropharyngeal phase: Secondary | ICD-10-CM | POA: Diagnosis not present

## 2023-07-01 DIAGNOSIS — J690 Pneumonitis due to inhalation of food and vomit: Secondary | ICD-10-CM | POA: Diagnosis not present

## 2023-07-01 DIAGNOSIS — R1313 Dysphagia, pharyngeal phase: Secondary | ICD-10-CM | POA: Diagnosis not present

## 2023-07-01 DIAGNOSIS — J440 Chronic obstructive pulmonary disease with acute lower respiratory infection: Secondary | ICD-10-CM | POA: Diagnosis not present

## 2023-07-01 DIAGNOSIS — E1122 Type 2 diabetes mellitus with diabetic chronic kidney disease: Secondary | ICD-10-CM | POA: Diagnosis not present

## 2023-07-01 DIAGNOSIS — Z7984 Long term (current) use of oral hypoglycemic drugs: Secondary | ICD-10-CM | POA: Diagnosis not present

## 2023-07-01 DIAGNOSIS — I21A1 Myocardial infarction type 2: Secondary | ICD-10-CM | POA: Diagnosis not present

## 2023-07-01 DIAGNOSIS — R0602 Shortness of breath: Secondary | ICD-10-CM | POA: Diagnosis not present

## 2023-07-01 DIAGNOSIS — Z931 Gastrostomy status: Secondary | ICD-10-CM | POA: Diagnosis not present

## 2023-07-01 DIAGNOSIS — S91301A Unspecified open wound, right foot, initial encounter: Secondary | ICD-10-CM | POA: Diagnosis not present

## 2023-07-01 DIAGNOSIS — I6381 Other cerebral infarction due to occlusion or stenosis of small artery: Secondary | ICD-10-CM | POA: Diagnosis not present

## 2023-07-01 DIAGNOSIS — N3289 Other specified disorders of bladder: Secondary | ICD-10-CM | POA: Diagnosis not present

## 2023-07-01 DIAGNOSIS — I70201 Unspecified atherosclerosis of native arteries of extremities, right leg: Secondary | ICD-10-CM | POA: Diagnosis not present

## 2023-07-01 DIAGNOSIS — K922 Gastrointestinal hemorrhage, unspecified: Secondary | ICD-10-CM | POA: Diagnosis not present

## 2023-07-01 DIAGNOSIS — T8789 Other complications of amputation stump: Secondary | ICD-10-CM | POA: Diagnosis not present

## 2023-07-01 DIAGNOSIS — R6521 Severe sepsis with septic shock: Secondary | ICD-10-CM | POA: Diagnosis not present

## 2023-07-01 DIAGNOSIS — E43 Unspecified severe protein-calorie malnutrition: Secondary | ICD-10-CM | POA: Diagnosis not present

## 2023-07-01 DIAGNOSIS — I69391 Dysphagia following cerebral infarction: Secondary | ICD-10-CM | POA: Diagnosis not present

## 2023-07-01 DIAGNOSIS — Q632 Ectopic kidney: Secondary | ICD-10-CM | POA: Diagnosis not present

## 2023-07-01 DIAGNOSIS — J984 Other disorders of lung: Secondary | ICD-10-CM | POA: Diagnosis not present

## 2023-07-01 DIAGNOSIS — I672 Cerebral atherosclerosis: Secondary | ICD-10-CM | POA: Diagnosis not present

## 2023-07-01 DIAGNOSIS — R188 Other ascites: Secondary | ICD-10-CM | POA: Diagnosis not present

## 2023-07-01 DIAGNOSIS — A419 Sepsis, unspecified organism: Secondary | ICD-10-CM | POA: Diagnosis not present

## 2023-07-01 DIAGNOSIS — I059 Rheumatic mitral valve disease, unspecified: Secondary | ICD-10-CM | POA: Diagnosis not present

## 2023-07-01 DIAGNOSIS — R29818 Other symptoms and signs involving the nervous system: Secondary | ICD-10-CM | POA: Diagnosis not present

## 2023-07-01 DIAGNOSIS — F17210 Nicotine dependence, cigarettes, uncomplicated: Secondary | ICD-10-CM | POA: Diagnosis not present

## 2023-07-01 DIAGNOSIS — G8191 Hemiplegia, unspecified affecting right dominant side: Secondary | ICD-10-CM | POA: Diagnosis not present

## 2023-07-01 DIAGNOSIS — I499 Cardiac arrhythmia, unspecified: Secondary | ICD-10-CM | POA: Diagnosis not present

## 2023-07-02 DIAGNOSIS — R2990 Unspecified symptoms and signs involving the nervous system: Secondary | ICD-10-CM | POA: Diagnosis not present

## 2023-07-02 DIAGNOSIS — I059 Rheumatic mitral valve disease, unspecified: Secondary | ICD-10-CM | POA: Diagnosis not present

## 2023-07-02 DIAGNOSIS — R579 Shock, unspecified: Secondary | ICD-10-CM | POA: Diagnosis not present

## 2023-07-02 DIAGNOSIS — Z978 Presence of other specified devices: Secondary | ICD-10-CM | POA: Diagnosis not present

## 2023-07-02 DIAGNOSIS — I6782 Cerebral ischemia: Secondary | ICD-10-CM | POA: Diagnosis not present

## 2023-07-02 DIAGNOSIS — E1111 Type 2 diabetes mellitus with ketoacidosis with coma: Secondary | ICD-10-CM | POA: Diagnosis not present

## 2023-07-02 DIAGNOSIS — J9601 Acute respiratory failure with hypoxia: Secondary | ICD-10-CM | POA: Diagnosis not present

## 2023-07-02 DIAGNOSIS — J9690 Respiratory failure, unspecified, unspecified whether with hypoxia or hypercapnia: Secondary | ICD-10-CM | POA: Diagnosis not present

## 2023-07-02 DIAGNOSIS — I639 Cerebral infarction, unspecified: Secondary | ICD-10-CM | POA: Diagnosis not present

## 2023-07-03 DIAGNOSIS — Z978 Presence of other specified devices: Secondary | ICD-10-CM | POA: Diagnosis not present

## 2023-07-03 DIAGNOSIS — E1111 Type 2 diabetes mellitus with ketoacidosis with coma: Secondary | ICD-10-CM | POA: Diagnosis not present

## 2023-07-03 DIAGNOSIS — J9690 Respiratory failure, unspecified, unspecified whether with hypoxia or hypercapnia: Secondary | ICD-10-CM | POA: Diagnosis not present

## 2023-07-03 DIAGNOSIS — R579 Shock, unspecified: Secondary | ICD-10-CM | POA: Diagnosis not present

## 2023-07-03 DIAGNOSIS — J9601 Acute respiratory failure with hypoxia: Secondary | ICD-10-CM | POA: Diagnosis not present

## 2023-07-04 DIAGNOSIS — E1111 Type 2 diabetes mellitus with ketoacidosis with coma: Secondary | ICD-10-CM | POA: Diagnosis not present

## 2023-07-04 DIAGNOSIS — R579 Shock, unspecified: Secondary | ICD-10-CM | POA: Diagnosis not present

## 2023-07-04 DIAGNOSIS — R918 Other nonspecific abnormal finding of lung field: Secondary | ICD-10-CM | POA: Diagnosis not present

## 2023-07-04 DIAGNOSIS — J9601 Acute respiratory failure with hypoxia: Secondary | ICD-10-CM | POA: Diagnosis not present

## 2023-07-04 DIAGNOSIS — J9690 Respiratory failure, unspecified, unspecified whether with hypoxia or hypercapnia: Secondary | ICD-10-CM | POA: Diagnosis not present

## 2023-07-04 DIAGNOSIS — M7989 Other specified soft tissue disorders: Secondary | ICD-10-CM | POA: Diagnosis not present

## 2023-07-04 DIAGNOSIS — Z4682 Encounter for fitting and adjustment of non-vascular catheter: Secondary | ICD-10-CM | POA: Diagnosis not present

## 2023-07-05 DIAGNOSIS — E1111 Type 2 diabetes mellitus with ketoacidosis with coma: Secondary | ICD-10-CM | POA: Diagnosis not present

## 2023-07-05 DIAGNOSIS — R29818 Other symptoms and signs involving the nervous system: Secondary | ICD-10-CM | POA: Diagnosis not present

## 2023-07-05 DIAGNOSIS — J9601 Acute respiratory failure with hypoxia: Secondary | ICD-10-CM | POA: Diagnosis not present

## 2023-07-05 DIAGNOSIS — H748X3 Other specified disorders of middle ear and mastoid, bilateral: Secondary | ICD-10-CM | POA: Diagnosis not present

## 2023-07-05 DIAGNOSIS — R579 Shock, unspecified: Secondary | ICD-10-CM | POA: Diagnosis not present

## 2023-07-05 DIAGNOSIS — J9690 Respiratory failure, unspecified, unspecified whether with hypoxia or hypercapnia: Secondary | ICD-10-CM | POA: Diagnosis not present

## 2023-07-05 DIAGNOSIS — I6381 Other cerebral infarction due to occlusion or stenosis of small artery: Secondary | ICD-10-CM | POA: Diagnosis not present

## 2023-07-06 DIAGNOSIS — J9601 Acute respiratory failure with hypoxia: Secondary | ICD-10-CM | POA: Diagnosis not present

## 2023-07-06 DIAGNOSIS — E1111 Type 2 diabetes mellitus with ketoacidosis with coma: Secondary | ICD-10-CM | POA: Diagnosis not present

## 2023-07-06 DIAGNOSIS — J9690 Respiratory failure, unspecified, unspecified whether with hypoxia or hypercapnia: Secondary | ICD-10-CM | POA: Diagnosis not present

## 2023-07-06 DIAGNOSIS — R579 Shock, unspecified: Secondary | ICD-10-CM | POA: Diagnosis not present

## 2023-07-07 DIAGNOSIS — E1111 Type 2 diabetes mellitus with ketoacidosis with coma: Secondary | ICD-10-CM | POA: Diagnosis not present

## 2023-07-07 DIAGNOSIS — J9601 Acute respiratory failure with hypoxia: Secondary | ICD-10-CM | POA: Diagnosis not present

## 2023-07-07 DIAGNOSIS — R579 Shock, unspecified: Secondary | ICD-10-CM | POA: Diagnosis not present

## 2023-07-07 DIAGNOSIS — J9690 Respiratory failure, unspecified, unspecified whether with hypoxia or hypercapnia: Secondary | ICD-10-CM | POA: Diagnosis not present

## 2023-07-08 DIAGNOSIS — J9601 Acute respiratory failure with hypoxia: Secondary | ICD-10-CM | POA: Diagnosis not present

## 2023-07-08 DIAGNOSIS — A419 Sepsis, unspecified organism: Secondary | ICD-10-CM | POA: Diagnosis not present

## 2023-07-08 DIAGNOSIS — N179 Acute kidney failure, unspecified: Secondary | ICD-10-CM | POA: Diagnosis not present

## 2023-07-08 DIAGNOSIS — D649 Anemia, unspecified: Secondary | ICD-10-CM | POA: Diagnosis not present

## 2023-07-08 DIAGNOSIS — G9340 Encephalopathy, unspecified: Secondary | ICD-10-CM | POA: Diagnosis not present

## 2023-07-08 DIAGNOSIS — R6521 Severe sepsis with septic shock: Secondary | ICD-10-CM | POA: Diagnosis not present

## 2023-07-08 DIAGNOSIS — U071 COVID-19: Secondary | ICD-10-CM | POA: Diagnosis not present

## 2023-07-09 DIAGNOSIS — J9601 Acute respiratory failure with hypoxia: Secondary | ICD-10-CM | POA: Diagnosis not present

## 2023-07-10 DIAGNOSIS — J9601 Acute respiratory failure with hypoxia: Secondary | ICD-10-CM | POA: Diagnosis not present

## 2023-07-11 DIAGNOSIS — J9601 Acute respiratory failure with hypoxia: Secondary | ICD-10-CM | POA: Diagnosis not present

## 2023-07-12 DIAGNOSIS — M7989 Other specified soft tissue disorders: Secondary | ICD-10-CM | POA: Diagnosis not present

## 2023-07-12 DIAGNOSIS — J9601 Acute respiratory failure with hypoxia: Secondary | ICD-10-CM | POA: Diagnosis not present

## 2023-07-13 DIAGNOSIS — J9601 Acute respiratory failure with hypoxia: Secondary | ICD-10-CM | POA: Diagnosis not present

## 2023-07-14 DIAGNOSIS — J9601 Acute respiratory failure with hypoxia: Secondary | ICD-10-CM | POA: Diagnosis not present

## 2023-07-14 DIAGNOSIS — R1313 Dysphagia, pharyngeal phase: Secondary | ICD-10-CM | POA: Diagnosis not present

## 2023-07-15 DIAGNOSIS — R509 Fever, unspecified: Secondary | ICD-10-CM | POA: Diagnosis not present

## 2023-07-15 DIAGNOSIS — R918 Other nonspecific abnormal finding of lung field: Secondary | ICD-10-CM | POA: Diagnosis not present

## 2023-07-15 DIAGNOSIS — J9601 Acute respiratory failure with hypoxia: Secondary | ICD-10-CM | POA: Diagnosis not present

## 2023-07-16 DIAGNOSIS — I82411 Acute embolism and thrombosis of right femoral vein: Secondary | ICD-10-CM | POA: Diagnosis not present

## 2023-07-16 DIAGNOSIS — J9601 Acute respiratory failure with hypoxia: Secondary | ICD-10-CM | POA: Diagnosis not present

## 2023-07-16 DIAGNOSIS — J9690 Respiratory failure, unspecified, unspecified whether with hypoxia or hypercapnia: Secondary | ICD-10-CM | POA: Diagnosis not present

## 2023-07-16 DIAGNOSIS — J439 Emphysema, unspecified: Secondary | ICD-10-CM | POA: Diagnosis not present

## 2023-07-16 DIAGNOSIS — I82431 Acute embolism and thrombosis of right popliteal vein: Secondary | ICD-10-CM | POA: Diagnosis not present

## 2023-07-16 DIAGNOSIS — J9811 Atelectasis: Secondary | ICD-10-CM | POA: Diagnosis not present

## 2023-07-16 DIAGNOSIS — J90 Pleural effusion, not elsewhere classified: Secondary | ICD-10-CM | POA: Diagnosis not present

## 2023-07-17 DIAGNOSIS — E1111 Type 2 diabetes mellitus with ketoacidosis with coma: Secondary | ICD-10-CM | POA: Diagnosis not present

## 2023-07-17 DIAGNOSIS — R918 Other nonspecific abnormal finding of lung field: Secondary | ICD-10-CM | POA: Diagnosis not present

## 2023-07-17 DIAGNOSIS — T17998A Other foreign object in respiratory tract, part unspecified causing other injury, initial encounter: Secondary | ICD-10-CM | POA: Diagnosis not present

## 2023-07-17 DIAGNOSIS — R1313 Dysphagia, pharyngeal phase: Secondary | ICD-10-CM | POA: Diagnosis not present

## 2023-07-17 DIAGNOSIS — Z4682 Encounter for fitting and adjustment of non-vascular catheter: Secondary | ICD-10-CM | POA: Diagnosis not present

## 2023-07-17 DIAGNOSIS — J9690 Respiratory failure, unspecified, unspecified whether with hypoxia or hypercapnia: Secondary | ICD-10-CM | POA: Diagnosis not present

## 2023-07-17 DIAGNOSIS — J90 Pleural effusion, not elsewhere classified: Secondary | ICD-10-CM | POA: Diagnosis not present

## 2023-07-17 DIAGNOSIS — J9601 Acute respiratory failure with hypoxia: Secondary | ICD-10-CM | POA: Diagnosis not present

## 2023-07-18 DIAGNOSIS — R1313 Dysphagia, pharyngeal phase: Secondary | ICD-10-CM | POA: Diagnosis not present

## 2023-07-18 DIAGNOSIS — G319 Degenerative disease of nervous system, unspecified: Secondary | ICD-10-CM | POA: Diagnosis not present

## 2023-07-18 DIAGNOSIS — R9082 White matter disease, unspecified: Secondary | ICD-10-CM | POA: Diagnosis not present

## 2023-07-18 DIAGNOSIS — I6782 Cerebral ischemia: Secondary | ICD-10-CM | POA: Diagnosis not present

## 2023-07-18 DIAGNOSIS — J9601 Acute respiratory failure with hypoxia: Secondary | ICD-10-CM | POA: Diagnosis not present

## 2023-07-19 DIAGNOSIS — T17998A Other foreign object in respiratory tract, part unspecified causing other injury, initial encounter: Secondary | ICD-10-CM | POA: Diagnosis not present

## 2023-07-19 DIAGNOSIS — J984 Other disorders of lung: Secondary | ICD-10-CM | POA: Diagnosis not present

## 2023-07-19 DIAGNOSIS — J90 Pleural effusion, not elsewhere classified: Secondary | ICD-10-CM | POA: Diagnosis not present

## 2023-07-19 DIAGNOSIS — R1313 Dysphagia, pharyngeal phase: Secondary | ICD-10-CM | POA: Diagnosis not present

## 2023-07-19 DIAGNOSIS — Z4682 Encounter for fitting and adjustment of non-vascular catheter: Secondary | ICD-10-CM | POA: Diagnosis not present

## 2023-07-19 DIAGNOSIS — J9601 Acute respiratory failure with hypoxia: Secondary | ICD-10-CM | POA: Diagnosis not present

## 2023-07-20 DIAGNOSIS — R1313 Dysphagia, pharyngeal phase: Secondary | ICD-10-CM | POA: Diagnosis not present

## 2023-07-20 DIAGNOSIS — J9601 Acute respiratory failure with hypoxia: Secondary | ICD-10-CM | POA: Diagnosis not present

## 2023-07-21 DIAGNOSIS — R1313 Dysphagia, pharyngeal phase: Secondary | ICD-10-CM | POA: Diagnosis not present

## 2023-07-21 DIAGNOSIS — J9601 Acute respiratory failure with hypoxia: Secondary | ICD-10-CM | POA: Diagnosis not present

## 2023-07-22 DIAGNOSIS — R627 Adult failure to thrive: Secondary | ICD-10-CM | POA: Diagnosis not present

## 2023-07-22 DIAGNOSIS — J690 Pneumonitis due to inhalation of food and vomit: Secondary | ICD-10-CM | POA: Diagnosis not present

## 2023-07-22 DIAGNOSIS — A419 Sepsis, unspecified organism: Secondary | ICD-10-CM | POA: Diagnosis not present

## 2023-07-22 DIAGNOSIS — N179 Acute kidney failure, unspecified: Secondary | ICD-10-CM | POA: Diagnosis not present

## 2023-07-22 DIAGNOSIS — E43 Unspecified severe protein-calorie malnutrition: Secondary | ICD-10-CM | POA: Diagnosis not present

## 2023-07-22 DIAGNOSIS — J9601 Acute respiratory failure with hypoxia: Secondary | ICD-10-CM | POA: Diagnosis not present

## 2023-07-22 DIAGNOSIS — D649 Anemia, unspecified: Secondary | ICD-10-CM | POA: Diagnosis not present

## 2023-07-23 DIAGNOSIS — J9601 Acute respiratory failure with hypoxia: Secondary | ICD-10-CM | POA: Diagnosis not present

## 2023-07-24 DIAGNOSIS — R1312 Dysphagia, oropharyngeal phase: Secondary | ICD-10-CM | POA: Diagnosis not present

## 2023-07-24 DIAGNOSIS — I639 Cerebral infarction, unspecified: Secondary | ICD-10-CM | POA: Diagnosis not present

## 2023-07-24 DIAGNOSIS — R0602 Shortness of breath: Secondary | ICD-10-CM | POA: Diagnosis not present

## 2023-07-24 DIAGNOSIS — R918 Other nonspecific abnormal finding of lung field: Secondary | ICD-10-CM | POA: Diagnosis not present

## 2023-07-24 DIAGNOSIS — R4701 Aphasia: Secondary | ICD-10-CM | POA: Diagnosis not present

## 2023-07-24 DIAGNOSIS — G8191 Hemiplegia, unspecified affecting right dominant side: Secondary | ICD-10-CM | POA: Diagnosis not present

## 2023-07-24 DIAGNOSIS — J9601 Acute respiratory failure with hypoxia: Secondary | ICD-10-CM | POA: Diagnosis not present

## 2023-07-25 DIAGNOSIS — F17210 Nicotine dependence, cigarettes, uncomplicated: Secondary | ICD-10-CM | POA: Diagnosis not present

## 2023-07-25 DIAGNOSIS — J9601 Acute respiratory failure with hypoxia: Secondary | ICD-10-CM | POA: Diagnosis not present

## 2023-07-25 DIAGNOSIS — J439 Emphysema, unspecified: Secondary | ICD-10-CM | POA: Diagnosis not present

## 2023-07-25 DIAGNOSIS — I69391 Dysphagia following cerebral infarction: Secondary | ICD-10-CM | POA: Diagnosis not present

## 2023-07-25 DIAGNOSIS — I639 Cerebral infarction, unspecified: Secondary | ICD-10-CM | POA: Diagnosis not present

## 2023-07-25 DIAGNOSIS — E1111 Type 2 diabetes mellitus with ketoacidosis with coma: Secondary | ICD-10-CM | POA: Diagnosis not present

## 2023-07-25 DIAGNOSIS — N189 Chronic kidney disease, unspecified: Secondary | ICD-10-CM | POA: Diagnosis not present

## 2023-07-25 DIAGNOSIS — N179 Acute kidney failure, unspecified: Secondary | ICD-10-CM | POA: Diagnosis not present

## 2023-07-25 DIAGNOSIS — I129 Hypertensive chronic kidney disease with stage 1 through stage 4 chronic kidney disease, or unspecified chronic kidney disease: Secondary | ICD-10-CM | POA: Diagnosis not present

## 2023-07-25 DIAGNOSIS — R1310 Dysphagia, unspecified: Secondary | ICD-10-CM | POA: Diagnosis not present

## 2023-07-25 DIAGNOSIS — E1122 Type 2 diabetes mellitus with diabetic chronic kidney disease: Secondary | ICD-10-CM | POA: Diagnosis not present

## 2023-07-25 DIAGNOSIS — R1312 Dysphagia, oropharyngeal phase: Secondary | ICD-10-CM | POA: Diagnosis not present

## 2023-07-26 DIAGNOSIS — I639 Cerebral infarction, unspecified: Secondary | ICD-10-CM | POA: Diagnosis not present

## 2023-07-26 DIAGNOSIS — R4701 Aphasia: Secondary | ICD-10-CM | POA: Diagnosis not present

## 2023-07-26 DIAGNOSIS — G8191 Hemiplegia, unspecified affecting right dominant side: Secondary | ICD-10-CM | POA: Diagnosis not present

## 2023-07-26 DIAGNOSIS — J9601 Acute respiratory failure with hypoxia: Secondary | ICD-10-CM | POA: Diagnosis not present

## 2023-07-26 DIAGNOSIS — R1312 Dysphagia, oropharyngeal phase: Secondary | ICD-10-CM | POA: Diagnosis not present

## 2023-07-27 DIAGNOSIS — J9601 Acute respiratory failure with hypoxia: Secondary | ICD-10-CM | POA: Diagnosis not present

## 2023-07-28 DIAGNOSIS — J984 Other disorders of lung: Secondary | ICD-10-CM | POA: Diagnosis not present

## 2023-07-28 DIAGNOSIS — I96 Gangrene, not elsewhere classified: Secondary | ICD-10-CM | POA: Diagnosis not present

## 2023-07-28 DIAGNOSIS — J9601 Acute respiratory failure with hypoxia: Secondary | ICD-10-CM | POA: Diagnosis not present

## 2023-07-29 DIAGNOSIS — E1111 Type 2 diabetes mellitus with ketoacidosis with coma: Secondary | ICD-10-CM | POA: Diagnosis not present

## 2023-07-29 DIAGNOSIS — I96 Gangrene, not elsewhere classified: Secondary | ICD-10-CM | POA: Diagnosis not present

## 2023-07-29 DIAGNOSIS — J9601 Acute respiratory failure with hypoxia: Secondary | ICD-10-CM | POA: Diagnosis not present

## 2023-07-29 DIAGNOSIS — R579 Shock, unspecified: Secondary | ICD-10-CM | POA: Diagnosis not present

## 2023-07-30 DIAGNOSIS — J9601 Acute respiratory failure with hypoxia: Secondary | ICD-10-CM | POA: Diagnosis not present

## 2023-07-31 DIAGNOSIS — E1111 Type 2 diabetes mellitus with ketoacidosis with coma: Secondary | ICD-10-CM | POA: Diagnosis not present

## 2023-07-31 DIAGNOSIS — J9601 Acute respiratory failure with hypoxia: Secondary | ICD-10-CM | POA: Diagnosis not present

## 2023-08-01 DIAGNOSIS — J9601 Acute respiratory failure with hypoxia: Secondary | ICD-10-CM | POA: Diagnosis not present

## 2023-08-01 DIAGNOSIS — I96 Gangrene, not elsewhere classified: Secondary | ICD-10-CM | POA: Diagnosis not present

## 2023-08-02 DIAGNOSIS — J9601 Acute respiratory failure with hypoxia: Secondary | ICD-10-CM | POA: Diagnosis not present

## 2023-08-02 DIAGNOSIS — S91301A Unspecified open wound, right foot, initial encounter: Secondary | ICD-10-CM | POA: Diagnosis not present

## 2023-08-02 DIAGNOSIS — E119 Type 2 diabetes mellitus without complications: Secondary | ICD-10-CM | POA: Diagnosis not present

## 2023-08-02 DIAGNOSIS — J439 Emphysema, unspecified: Secondary | ICD-10-CM | POA: Diagnosis not present

## 2023-08-02 DIAGNOSIS — F17210 Nicotine dependence, cigarettes, uncomplicated: Secondary | ICD-10-CM | POA: Diagnosis not present

## 2023-08-02 DIAGNOSIS — I96 Gangrene, not elsewhere classified: Secondary | ICD-10-CM | POA: Diagnosis not present

## 2023-08-02 DIAGNOSIS — I10 Essential (primary) hypertension: Secondary | ICD-10-CM | POA: Diagnosis not present

## 2023-08-03 DIAGNOSIS — R918 Other nonspecific abnormal finding of lung field: Secondary | ICD-10-CM | POA: Diagnosis not present

## 2023-08-03 DIAGNOSIS — R509 Fever, unspecified: Secondary | ICD-10-CM | POA: Diagnosis not present

## 2023-08-03 DIAGNOSIS — J9601 Acute respiratory failure with hypoxia: Secondary | ICD-10-CM | POA: Diagnosis not present

## 2023-08-04 DIAGNOSIS — J9601 Acute respiratory failure with hypoxia: Secondary | ICD-10-CM | POA: Diagnosis not present

## 2023-08-05 DIAGNOSIS — J9601 Acute respiratory failure with hypoxia: Secondary | ICD-10-CM | POA: Diagnosis not present

## 2023-08-06 DIAGNOSIS — I6782 Cerebral ischemia: Secondary | ICD-10-CM | POA: Diagnosis not present

## 2023-08-06 DIAGNOSIS — R569 Unspecified convulsions: Secondary | ICD-10-CM | POA: Diagnosis not present

## 2023-08-06 DIAGNOSIS — J9601 Acute respiratory failure with hypoxia: Secondary | ICD-10-CM | POA: Diagnosis not present

## 2023-08-06 DIAGNOSIS — R531 Weakness: Secondary | ICD-10-CM | POA: Diagnosis not present

## 2023-08-07 DIAGNOSIS — J9601 Acute respiratory failure with hypoxia: Secondary | ICD-10-CM | POA: Diagnosis not present

## 2023-08-08 DIAGNOSIS — J9601 Acute respiratory failure with hypoxia: Secondary | ICD-10-CM | POA: Diagnosis not present

## 2023-08-09 DIAGNOSIS — J9601 Acute respiratory failure with hypoxia: Secondary | ICD-10-CM | POA: Diagnosis not present

## 2023-08-10 DIAGNOSIS — J9601 Acute respiratory failure with hypoxia: Secondary | ICD-10-CM | POA: Diagnosis not present

## 2023-08-11 DIAGNOSIS — R0602 Shortness of breath: Secondary | ICD-10-CM | POA: Diagnosis not present

## 2023-08-11 DIAGNOSIS — J9601 Acute respiratory failure with hypoxia: Secondary | ICD-10-CM | POA: Diagnosis not present

## 2023-08-11 DIAGNOSIS — R918 Other nonspecific abnormal finding of lung field: Secondary | ICD-10-CM | POA: Diagnosis not present

## 2023-08-12 DIAGNOSIS — J90 Pleural effusion, not elsewhere classified: Secondary | ICD-10-CM | POA: Diagnosis not present

## 2023-08-12 DIAGNOSIS — R0603 Acute respiratory distress: Secondary | ICD-10-CM | POA: Diagnosis not present

## 2023-08-12 DIAGNOSIS — J9601 Acute respiratory failure with hypoxia: Secondary | ICD-10-CM | POA: Diagnosis not present

## 2023-08-13 DIAGNOSIS — R918 Other nonspecific abnormal finding of lung field: Secondary | ICD-10-CM | POA: Diagnosis not present

## 2023-08-13 DIAGNOSIS — N3289 Other specified disorders of bladder: Secondary | ICD-10-CM | POA: Diagnosis not present

## 2023-08-13 DIAGNOSIS — J9601 Acute respiratory failure with hypoxia: Secondary | ICD-10-CM | POA: Diagnosis not present

## 2023-08-13 DIAGNOSIS — J90 Pleural effusion, not elsewhere classified: Secondary | ICD-10-CM | POA: Diagnosis not present

## 2023-08-13 DIAGNOSIS — Q632 Ectopic kidney: Secondary | ICD-10-CM | POA: Diagnosis not present

## 2023-08-13 DIAGNOSIS — I2510 Atherosclerotic heart disease of native coronary artery without angina pectoris: Secondary | ICD-10-CM | POA: Diagnosis not present

## 2023-08-13 DIAGNOSIS — I639 Cerebral infarction, unspecified: Secondary | ICD-10-CM | POA: Diagnosis not present

## 2023-08-13 DIAGNOSIS — R160 Hepatomegaly, not elsewhere classified: Secondary | ICD-10-CM | POA: Diagnosis not present

## 2023-08-13 DIAGNOSIS — R1312 Dysphagia, oropharyngeal phase: Secondary | ICD-10-CM | POA: Diagnosis not present

## 2023-08-13 DIAGNOSIS — I700 Atherosclerosis of aorta: Secondary | ICD-10-CM | POA: Diagnosis not present

## 2023-08-13 DIAGNOSIS — R4701 Aphasia: Secondary | ICD-10-CM | POA: Diagnosis not present

## 2023-08-13 DIAGNOSIS — R188 Other ascites: Secondary | ICD-10-CM | POA: Diagnosis not present

## 2023-08-13 DIAGNOSIS — Z515 Encounter for palliative care: Secondary | ICD-10-CM | POA: Diagnosis not present

## 2023-08-14 DIAGNOSIS — J9601 Acute respiratory failure with hypoxia: Secondary | ICD-10-CM | POA: Diagnosis not present

## 2023-08-15 DIAGNOSIS — R4701 Aphasia: Secondary | ICD-10-CM | POA: Diagnosis not present

## 2023-08-15 DIAGNOSIS — I69391 Dysphagia following cerebral infarction: Secondary | ICD-10-CM | POA: Diagnosis not present

## 2023-08-15 DIAGNOSIS — E870 Hyperosmolality and hypernatremia: Secondary | ICD-10-CM | POA: Diagnosis not present

## 2023-08-15 DIAGNOSIS — I639 Cerebral infarction, unspecified: Secondary | ICD-10-CM | POA: Diagnosis not present

## 2023-08-15 DIAGNOSIS — I96 Gangrene, not elsewhere classified: Secondary | ICD-10-CM | POA: Diagnosis not present

## 2023-08-15 DIAGNOSIS — U071 COVID-19: Secondary | ICD-10-CM | POA: Diagnosis not present

## 2023-08-15 DIAGNOSIS — I10 Essential (primary) hypertension: Secondary | ICD-10-CM | POA: Diagnosis not present

## 2023-08-15 DIAGNOSIS — R918 Other nonspecific abnormal finding of lung field: Secondary | ICD-10-CM | POA: Diagnosis not present

## 2023-08-15 DIAGNOSIS — J9601 Acute respiratory failure with hypoxia: Secondary | ICD-10-CM | POA: Diagnosis not present

## 2023-08-15 DIAGNOSIS — R0602 Shortness of breath: Secondary | ICD-10-CM | POA: Diagnosis not present

## 2023-08-16 DIAGNOSIS — I639 Cerebral infarction, unspecified: Secondary | ICD-10-CM | POA: Diagnosis not present

## 2023-08-16 DIAGNOSIS — J9601 Acute respiratory failure with hypoxia: Secondary | ICD-10-CM | POA: Diagnosis not present

## 2023-08-16 DIAGNOSIS — R918 Other nonspecific abnormal finding of lung field: Secondary | ICD-10-CM | POA: Diagnosis not present

## 2023-08-16 DIAGNOSIS — I10 Essential (primary) hypertension: Secondary | ICD-10-CM | POA: Diagnosis not present

## 2023-08-16 DIAGNOSIS — E870 Hyperosmolality and hypernatremia: Secondary | ICD-10-CM | POA: Diagnosis not present

## 2023-08-16 DIAGNOSIS — J90 Pleural effusion, not elsewhere classified: Secondary | ICD-10-CM | POA: Diagnosis not present

## 2023-08-16 DIAGNOSIS — I96 Gangrene, not elsewhere classified: Secondary | ICD-10-CM | POA: Diagnosis not present

## 2023-08-17 DIAGNOSIS — J9601 Acute respiratory failure with hypoxia: Secondary | ICD-10-CM | POA: Diagnosis not present

## 2023-08-17 DIAGNOSIS — R1312 Dysphagia, oropharyngeal phase: Secondary | ICD-10-CM | POA: Diagnosis not present

## 2023-08-17 DIAGNOSIS — I10 Essential (primary) hypertension: Secondary | ICD-10-CM | POA: Diagnosis not present

## 2023-08-17 DIAGNOSIS — I96 Gangrene, not elsewhere classified: Secondary | ICD-10-CM | POA: Diagnosis not present

## 2023-08-17 DIAGNOSIS — M7989 Other specified soft tissue disorders: Secondary | ICD-10-CM | POA: Diagnosis not present

## 2023-08-17 DIAGNOSIS — E870 Hyperosmolality and hypernatremia: Secondary | ICD-10-CM | POA: Diagnosis not present

## 2023-08-17 DIAGNOSIS — Z515 Encounter for palliative care: Secondary | ICD-10-CM | POA: Diagnosis not present

## 2023-08-17 DIAGNOSIS — J690 Pneumonitis due to inhalation of food and vomit: Secondary | ICD-10-CM | POA: Diagnosis not present

## 2023-08-17 DIAGNOSIS — I639 Cerebral infarction, unspecified: Secondary | ICD-10-CM | POA: Diagnosis not present

## 2023-08-18 DIAGNOSIS — E870 Hyperosmolality and hypernatremia: Secondary | ICD-10-CM | POA: Diagnosis not present

## 2023-08-18 DIAGNOSIS — I639 Cerebral infarction, unspecified: Secondary | ICD-10-CM | POA: Diagnosis not present

## 2023-08-18 DIAGNOSIS — I10 Essential (primary) hypertension: Secondary | ICD-10-CM | POA: Diagnosis not present

## 2023-08-18 DIAGNOSIS — J9601 Acute respiratory failure with hypoxia: Secondary | ICD-10-CM | POA: Diagnosis not present

## 2023-08-18 DIAGNOSIS — J690 Pneumonitis due to inhalation of food and vomit: Secondary | ICD-10-CM | POA: Diagnosis not present

## 2023-08-18 DIAGNOSIS — I96 Gangrene, not elsewhere classified: Secondary | ICD-10-CM | POA: Diagnosis not present

## 2023-08-18 DIAGNOSIS — Z515 Encounter for palliative care: Secondary | ICD-10-CM | POA: Diagnosis not present

## 2023-08-18 DIAGNOSIS — R1312 Dysphagia, oropharyngeal phase: Secondary | ICD-10-CM | POA: Diagnosis not present

## 2023-08-19 DIAGNOSIS — I10 Essential (primary) hypertension: Secondary | ICD-10-CM | POA: Diagnosis not present

## 2023-08-19 DIAGNOSIS — R4701 Aphasia: Secondary | ICD-10-CM | POA: Diagnosis not present

## 2023-08-19 DIAGNOSIS — E870 Hyperosmolality and hypernatremia: Secondary | ICD-10-CM | POA: Diagnosis not present

## 2023-08-19 DIAGNOSIS — I96 Gangrene, not elsewhere classified: Secondary | ICD-10-CM | POA: Diagnosis not present

## 2023-08-19 DIAGNOSIS — I639 Cerebral infarction, unspecified: Secondary | ICD-10-CM | POA: Diagnosis not present

## 2023-08-19 DIAGNOSIS — U071 COVID-19: Secondary | ICD-10-CM | POA: Diagnosis not present

## 2023-08-19 DIAGNOSIS — I69391 Dysphagia following cerebral infarction: Secondary | ICD-10-CM | POA: Diagnosis not present

## 2023-08-19 DIAGNOSIS — J9601 Acute respiratory failure with hypoxia: Secondary | ICD-10-CM | POA: Diagnosis not present

## 2023-08-19 DIAGNOSIS — Z515 Encounter for palliative care: Secondary | ICD-10-CM | POA: Diagnosis not present

## 2023-08-20 DIAGNOSIS — E870 Hyperosmolality and hypernatremia: Secondary | ICD-10-CM | POA: Diagnosis not present

## 2023-08-20 DIAGNOSIS — I10 Essential (primary) hypertension: Secondary | ICD-10-CM | POA: Diagnosis not present

## 2023-08-20 DIAGNOSIS — I96 Gangrene, not elsewhere classified: Secondary | ICD-10-CM | POA: Diagnosis not present

## 2023-08-20 DIAGNOSIS — Z515 Encounter for palliative care: Secondary | ICD-10-CM | POA: Diagnosis not present

## 2023-08-20 DIAGNOSIS — R1312 Dysphagia, oropharyngeal phase: Secondary | ICD-10-CM | POA: Diagnosis not present

## 2023-08-20 DIAGNOSIS — I69391 Dysphagia following cerebral infarction: Secondary | ICD-10-CM | POA: Diagnosis not present

## 2023-08-20 DIAGNOSIS — J9601 Acute respiratory failure with hypoxia: Secondary | ICD-10-CM | POA: Diagnosis not present

## 2023-08-20 DIAGNOSIS — Z931 Gastrostomy status: Secondary | ICD-10-CM | POA: Diagnosis not present

## 2023-08-20 DIAGNOSIS — D649 Anemia, unspecified: Secondary | ICD-10-CM | POA: Diagnosis not present

## 2023-08-20 DIAGNOSIS — I639 Cerebral infarction, unspecified: Secondary | ICD-10-CM | POA: Diagnosis not present

## 2023-08-20 DIAGNOSIS — R1313 Dysphagia, pharyngeal phase: Secondary | ICD-10-CM | POA: Diagnosis not present

## 2023-08-21 DIAGNOSIS — I639 Cerebral infarction, unspecified: Secondary | ICD-10-CM | POA: Diagnosis not present

## 2023-08-21 DIAGNOSIS — I96 Gangrene, not elsewhere classified: Secondary | ICD-10-CM | POA: Diagnosis not present

## 2023-08-21 DIAGNOSIS — J9601 Acute respiratory failure with hypoxia: Secondary | ICD-10-CM | POA: Diagnosis not present

## 2023-08-21 DIAGNOSIS — I10 Essential (primary) hypertension: Secondary | ICD-10-CM | POA: Diagnosis not present

## 2023-08-21 DIAGNOSIS — E870 Hyperosmolality and hypernatremia: Secondary | ICD-10-CM | POA: Diagnosis not present

## 2023-08-22 DIAGNOSIS — K625 Hemorrhage of anus and rectum: Secondary | ICD-10-CM | POA: Diagnosis not present

## 2023-08-22 DIAGNOSIS — D649 Anemia, unspecified: Secondary | ICD-10-CM | POA: Diagnosis not present

## 2023-08-22 DIAGNOSIS — E870 Hyperosmolality and hypernatremia: Secondary | ICD-10-CM | POA: Diagnosis not present

## 2023-08-22 DIAGNOSIS — I639 Cerebral infarction, unspecified: Secondary | ICD-10-CM | POA: Diagnosis not present

## 2023-08-22 DIAGNOSIS — J9601 Acute respiratory failure with hypoxia: Secondary | ICD-10-CM | POA: Diagnosis not present

## 2023-08-23 DIAGNOSIS — J9601 Acute respiratory failure with hypoxia: Secondary | ICD-10-CM | POA: Diagnosis not present

## 2023-08-23 DIAGNOSIS — J90 Pleural effusion, not elsewhere classified: Secondary | ICD-10-CM | POA: Diagnosis not present

## 2023-08-23 DIAGNOSIS — E870 Hyperosmolality and hypernatremia: Secondary | ICD-10-CM | POA: Diagnosis not present

## 2023-08-23 DIAGNOSIS — K625 Hemorrhage of anus and rectum: Secondary | ICD-10-CM | POA: Diagnosis not present

## 2023-08-23 DIAGNOSIS — I639 Cerebral infarction, unspecified: Secondary | ICD-10-CM | POA: Diagnosis not present

## 2023-08-23 DIAGNOSIS — D649 Anemia, unspecified: Secondary | ICD-10-CM | POA: Diagnosis not present

## 2023-08-24 DIAGNOSIS — K625 Hemorrhage of anus and rectum: Secondary | ICD-10-CM | POA: Diagnosis not present

## 2023-08-24 DIAGNOSIS — J9601 Acute respiratory failure with hypoxia: Secondary | ICD-10-CM | POA: Diagnosis not present

## 2023-08-24 DIAGNOSIS — Z48813 Encounter for surgical aftercare following surgery on the respiratory system: Secondary | ICD-10-CM | POA: Diagnosis not present

## 2023-08-24 DIAGNOSIS — E870 Hyperosmolality and hypernatremia: Secondary | ICD-10-CM | POA: Diagnosis not present

## 2023-08-24 DIAGNOSIS — J90 Pleural effusion, not elsewhere classified: Secondary | ICD-10-CM | POA: Diagnosis not present

## 2023-08-24 DIAGNOSIS — I639 Cerebral infarction, unspecified: Secondary | ICD-10-CM | POA: Diagnosis not present

## 2023-08-24 DIAGNOSIS — D649 Anemia, unspecified: Secondary | ICD-10-CM | POA: Diagnosis not present

## 2023-08-24 DIAGNOSIS — R918 Other nonspecific abnormal finding of lung field: Secondary | ICD-10-CM | POA: Diagnosis not present

## 2023-08-25 DIAGNOSIS — J9601 Acute respiratory failure with hypoxia: Secondary | ICD-10-CM | POA: Diagnosis not present

## 2023-08-25 DIAGNOSIS — E870 Hyperosmolality and hypernatremia: Secondary | ICD-10-CM | POA: Diagnosis not present

## 2023-08-25 DIAGNOSIS — I639 Cerebral infarction, unspecified: Secondary | ICD-10-CM | POA: Diagnosis not present

## 2023-08-26 DIAGNOSIS — I639 Cerebral infarction, unspecified: Secondary | ICD-10-CM | POA: Diagnosis not present

## 2023-08-26 DIAGNOSIS — J9601 Acute respiratory failure with hypoxia: Secondary | ICD-10-CM | POA: Diagnosis not present

## 2023-08-26 DIAGNOSIS — E870 Hyperosmolality and hypernatremia: Secondary | ICD-10-CM | POA: Diagnosis not present

## 2023-08-27 DIAGNOSIS — E870 Hyperosmolality and hypernatremia: Secondary | ICD-10-CM | POA: Diagnosis not present

## 2023-08-27 DIAGNOSIS — I639 Cerebral infarction, unspecified: Secondary | ICD-10-CM | POA: Diagnosis not present

## 2023-08-27 DIAGNOSIS — J9601 Acute respiratory failure with hypoxia: Secondary | ICD-10-CM | POA: Diagnosis not present

## 2023-08-28 DIAGNOSIS — I639 Cerebral infarction, unspecified: Secondary | ICD-10-CM | POA: Diagnosis not present

## 2023-08-28 DIAGNOSIS — E870 Hyperosmolality and hypernatremia: Secondary | ICD-10-CM | POA: Diagnosis not present

## 2023-08-28 DIAGNOSIS — E1111 Type 2 diabetes mellitus with ketoacidosis with coma: Secondary | ICD-10-CM | POA: Diagnosis not present

## 2023-08-28 DIAGNOSIS — J9601 Acute respiratory failure with hypoxia: Secondary | ICD-10-CM | POA: Diagnosis not present

## 2023-08-29 DIAGNOSIS — T8789 Other complications of amputation stump: Secondary | ICD-10-CM | POA: Diagnosis not present

## 2023-08-30 DIAGNOSIS — I96 Gangrene, not elsewhere classified: Secondary | ICD-10-CM | POA: Diagnosis not present

## 2023-08-30 DIAGNOSIS — R4701 Aphasia: Secondary | ICD-10-CM | POA: Diagnosis not present

## 2023-08-30 DIAGNOSIS — I69391 Dysphagia following cerebral infarction: Secondary | ICD-10-CM | POA: Diagnosis not present

## 2023-08-30 DIAGNOSIS — J9601 Acute respiratory failure with hypoxia: Secondary | ICD-10-CM | POA: Diagnosis not present

## 2023-08-30 DIAGNOSIS — I639 Cerebral infarction, unspecified: Secondary | ICD-10-CM | POA: Diagnosis not present

## 2023-08-31 DIAGNOSIS — R4701 Aphasia: Secondary | ICD-10-CM | POA: Diagnosis not present

## 2023-08-31 DIAGNOSIS — I96 Gangrene, not elsewhere classified: Secondary | ICD-10-CM | POA: Diagnosis not present

## 2023-08-31 DIAGNOSIS — I639 Cerebral infarction, unspecified: Secondary | ICD-10-CM | POA: Diagnosis not present

## 2023-08-31 DIAGNOSIS — I69391 Dysphagia following cerebral infarction: Secondary | ICD-10-CM | POA: Diagnosis not present

## 2023-08-31 DIAGNOSIS — J9601 Acute respiratory failure with hypoxia: Secondary | ICD-10-CM | POA: Diagnosis not present

## 2023-09-01 DIAGNOSIS — I69391 Dysphagia following cerebral infarction: Secondary | ICD-10-CM | POA: Diagnosis not present

## 2023-09-01 DIAGNOSIS — R918 Other nonspecific abnormal finding of lung field: Secondary | ICD-10-CM | POA: Diagnosis not present

## 2023-09-01 DIAGNOSIS — I639 Cerebral infarction, unspecified: Secondary | ICD-10-CM | POA: Diagnosis not present

## 2023-09-01 DIAGNOSIS — J9601 Acute respiratory failure with hypoxia: Secondary | ICD-10-CM | POA: Diagnosis not present

## 2023-09-01 DIAGNOSIS — R4701 Aphasia: Secondary | ICD-10-CM | POA: Diagnosis not present

## 2023-09-01 DIAGNOSIS — I96 Gangrene, not elsewhere classified: Secondary | ICD-10-CM | POA: Diagnosis not present

## 2023-09-01 DIAGNOSIS — J90 Pleural effusion, not elsewhere classified: Secondary | ICD-10-CM | POA: Diagnosis not present

## 2023-09-19 DEATH — deceased
